# Patient Record
Sex: Male | Born: 1945 | Race: White | Hispanic: No | Marital: Married | Smoking: Current every day smoker
Health system: Southern US, Community
[De-identification: ages and names within clinical notes are randomized; demographics above are authoritative.]

## PROBLEM LIST (undated history)

## (undated) DIAGNOSIS — M109 Gout, unspecified: Secondary | ICD-10-CM

## (undated) DIAGNOSIS — E785 Hyperlipidemia, unspecified: Secondary | ICD-10-CM

## (undated) DIAGNOSIS — IMO0001 Reserved for inherently not codable concepts without codable children: Secondary | ICD-10-CM

## (undated) DIAGNOSIS — M1990 Unspecified osteoarthritis, unspecified site: Secondary | ICD-10-CM

## (undated) DIAGNOSIS — I10 Essential (primary) hypertension: Secondary | ICD-10-CM

## (undated) DIAGNOSIS — K859 Acute pancreatitis without necrosis or infection, unspecified: Secondary | ICD-10-CM

## (undated) DIAGNOSIS — K259 Gastric ulcer, unspecified as acute or chronic, without hemorrhage or perforation: Secondary | ICD-10-CM

## (undated) DIAGNOSIS — F329 Major depressive disorder, single episode, unspecified: Secondary | ICD-10-CM

## (undated) DIAGNOSIS — I2510 Atherosclerotic heart disease of native coronary artery without angina pectoris: Secondary | ICD-10-CM

## (undated) DIAGNOSIS — F32A Depression, unspecified: Secondary | ICD-10-CM

## (undated) HISTORY — PX: CHOLECYSTECTOMY: SHX55

## (undated) HISTORY — PX: KNEE ARTHROSCOPY: SUR90

## (undated) HISTORY — PX: TONSILLECTOMY: SUR1361

## (undated) HISTORY — PX: CERVICAL FUSION: SHX112

## (undated) HISTORY — PX: CARDIAC CATHETERIZATION: SHX172

## (undated) HISTORY — PX: COLONOSCOPY: SHX174

## (undated) HISTORY — PX: KIDNEY STONE SURGERY: SHX686

---

## 2004-07-03 ENCOUNTER — Ambulatory Visit: Payer: Self-pay | Admitting: Family Medicine

## 2004-07-26 ENCOUNTER — Ambulatory Visit: Payer: Self-pay | Admitting: Family Medicine

## 2004-08-08 ENCOUNTER — Ambulatory Visit: Payer: Self-pay | Admitting: Family Medicine

## 2004-11-27 ENCOUNTER — Ambulatory Visit: Payer: Self-pay | Admitting: Family Medicine

## 2004-11-30 ENCOUNTER — Ambulatory Visit: Payer: Self-pay | Admitting: Family Medicine

## 2005-04-05 ENCOUNTER — Ambulatory Visit: Payer: Self-pay | Admitting: Family Medicine

## 2012-11-18 ENCOUNTER — Other Ambulatory Visit (HOSPITAL_COMMUNITY): Payer: Self-pay | Admitting: Internal Medicine

## 2012-11-18 DIAGNOSIS — R29898 Other symptoms and signs involving the musculoskeletal system: Secondary | ICD-10-CM

## 2012-11-19 ENCOUNTER — Ambulatory Visit (HOSPITAL_COMMUNITY): Payer: Self-pay

## 2014-09-29 ENCOUNTER — Other Ambulatory Visit: Payer: Self-pay | Admitting: Orthopedic Surgery

## 2014-09-29 DIAGNOSIS — Z01818 Encounter for other preprocedural examination: Secondary | ICD-10-CM

## 2014-10-01 ENCOUNTER — Ambulatory Visit
Admission: RE | Admit: 2014-10-01 | Discharge: 2014-10-01 | Disposition: A | Discharge status: Home / Self Care | Payer: Medicare HMO | Source: Ambulatory Visit | Attending: Orthopedic Surgery | Admitting: Orthopedic Surgery

## 2014-10-01 ENCOUNTER — Ambulatory Visit
Admission: RE | Admit: 2014-10-01 | Discharge: 2014-10-01 | Disposition: A | Discharge status: Home / Self Care | Payer: Self-pay | Source: Ambulatory Visit | Attending: Orthopedic Surgery | Admitting: Orthopedic Surgery

## 2014-10-01 DIAGNOSIS — Z01818 Encounter for other preprocedural examination: Secondary | ICD-10-CM

## 2014-10-18 ENCOUNTER — Other Ambulatory Visit: Payer: Self-pay | Admitting: Orthopedic Surgery

## 2014-10-27 NOTE — Pre-Procedure Instructions (Signed)
Norberto SorensonRalph L Whitcher  10/27/2014     Your procedure is scheduled on: Monday November 08, 2014 at 10:00 AM.  [facility] Ad[facility] at 8:00 A.M.  Call this number if you have problems the morning of surgery: [phone]    Remember:  Do not eat food or drink liquids after midnight.  Take these medicines the morning of surgery with A SIP OF WATER: Allopurinol (Zyloprim), Nebivolol (Bystolic), Omeprazole (Prilosec), and Venlafaxine (Effexor XR)   Please stop taking any vitamins, herbal medications, Ibuprofen, Aleve, etc on Monday July 11th   Do not wear jewelry.  Do not wear lotions, powders, or cologne.    Men may shave face and neck.  Do not bring valuables to the hospital.  [facility] is not responsible for any belongings or valuables.  Contacts, dentures or bridgework may not be worn into surgery.  Leave your suitcase in the car.  After surgery it may be brought to your room.  For patients admitted to the hospital, discharge time will be determined by your treatment team.  Patients discharged the day of surgery will not be allowed to drive home.   Name and phone number of your driver:    Special instructions: Shower using CHG soap the night before and the morning of your surgery  Please read over the following fact sheets that you were given. Pain Booklet, Coughing and Deep Breathing, Total Joint Packet, MRSA Information and Surgical Site Infection Prevention

## 2014-10-28 ENCOUNTER — Encounter (HOSPITAL_COMMUNITY): Payer: Self-pay

## 2014-10-28 ENCOUNTER — Encounter (HOSPITAL_COMMUNITY)
Admission: RE | Admit: 2014-10-28 | Discharge: 2014-10-28 | Disposition: A | Discharge status: Home / Self Care | Payer: Medicare HMO | Source: Ambulatory Visit | Attending: Orthopedic Surgery | Admitting: Orthopedic Surgery

## 2014-10-28 ENCOUNTER — Encounter (HOSPITAL_COMMUNITY): Payer: Self-pay | Admitting: Emergency Medicine

## 2014-10-28 DIAGNOSIS — Z01812 Encounter for preprocedural laboratory examination: Secondary | ICD-10-CM | POA: Diagnosis present

## 2014-10-28 DIAGNOSIS — I10 Essential (primary) hypertension: Secondary | ICD-10-CM | POA: Insufficient documentation

## 2014-10-28 DIAGNOSIS — R0602 Shortness of breath: Secondary | ICD-10-CM | POA: Diagnosis not present

## 2014-10-28 DIAGNOSIS — Z01818 Encounter for other preprocedural examination: Secondary | ICD-10-CM

## 2014-10-28 HISTORY — DX: Gastric ulcer, unspecified as acute or chronic, without hemorrhage or perforation: K25.9

## 2014-10-28 HISTORY — DX: Hyperlipidemia, unspecified: E78.5

## 2014-10-28 HISTORY — DX: Unspecified osteoarthritis, unspecified site: M19.90

## 2014-10-28 HISTORY — DX: Atherosclerotic heart disease of native coronary artery without angina pectoris: I25.10

## 2014-10-28 HISTORY — DX: Major depressive disorder, single episode, unspecified: F32.9

## 2014-10-28 HISTORY — DX: Depression, unspecified: F32.A

## 2014-10-28 HISTORY — DX: Gout, unspecified: M10.9

## 2014-10-28 HISTORY — DX: Reserved for inherently not codable concepts without codable children: IMO0001

## 2014-10-28 HISTORY — DX: Essential (primary) hypertension: I10

## 2014-10-28 HISTORY — DX: Acute pancreatitis without necrosis or infection, unspecified: K85.90

## 2014-10-28 LAB — CBC WITH DIFFERENTIAL/PLATELET
BASOS PCT: 1 % (ref 0–1)
Basophils Absolute: 0.1 10*3/uL (ref 0.0–0.1)
EOS ABS: 0.3 10*3/uL (ref 0.0–0.7)
EOS PCT: 4 % (ref 0–5)
HCT: 44.2 % (ref 39.0–52.0)
Hemoglobin: 15.6 g/dL (ref 13.0–17.0)
LYMPHS ABS: 1.5 10*3/uL (ref 0.7–4.0)
Lymphocytes Relative: 18 % (ref 12–46)
MCH: 31.6 pg (ref 26.0–34.0)
MCHC: 35.3 g/dL (ref 30.0–36.0)
MCV: 89.5 fL (ref 78.0–100.0)
Monocytes Absolute: 0.8 10*3/uL (ref 0.1–1.0)
Monocytes Relative: 9 % (ref 3–12)
NEUTROS PCT: 68 % (ref 43–77)
Neutro Abs: 5.8 10*3/uL (ref 1.7–7.7)
PLATELETS: 222 10*3/uL (ref 150–400)
RBC: 4.94 MIL/uL (ref 4.22–5.81)
RDW: 14.3 % (ref 11.5–15.5)
WBC: 8.5 10*3/uL (ref 4.0–10.5)

## 2014-10-28 LAB — URINALYSIS, ROUTINE W REFLEX MICROSCOPIC
Bilirubin Urine: NEGATIVE
GLUCOSE, UA: NEGATIVE mg/dL
Hgb urine dipstick: NEGATIVE
KETONES UR: NEGATIVE mg/dL
LEUKOCYTES UA: NEGATIVE
Nitrite: NEGATIVE
PROTEIN: NEGATIVE mg/dL
Specific Gravity, Urine: 1.013 (ref 1.005–1.030)
UROBILINOGEN UA: 1 mg/dL (ref 0.0–1.0)
pH: 6.5 (ref 5.0–8.0)

## 2014-10-28 LAB — COMPREHENSIVE METABOLIC PANEL
ALT: 35 U/L (ref 17–63)
AST: 24 U/L (ref 15–41)
Albumin: 3.4 g/dL — ABNORMAL LOW (ref 3.5–5.0)
Alkaline Phosphatase: 197 U/L — ABNORMAL HIGH (ref 38–126)
Anion gap: 9 (ref 5–15)
BUN: 11 mg/dL (ref 6–20)
CHLORIDE: 101 mmol/L (ref 101–111)
CO2: 27 mmol/L (ref 22–32)
Calcium: 9.1 mg/dL (ref 8.9–10.3)
Creatinine, Ser: 1.52 mg/dL — ABNORMAL HIGH (ref 0.61–1.24)
GFR calc Af Amer: 53 mL/min — ABNORMAL LOW (ref >60)
GFR calc non Af Amer: 45 mL/min — ABNORMAL LOW (ref >60)
Glucose, Bld: 101 mg/dL — ABNORMAL HIGH (ref 65–99)
Potassium: 4 mmol/L (ref 3.5–5.1)
SODIUM: 137 mmol/L (ref 135–145)
Total Bilirubin: 0.7 mg/dL (ref 0.3–1.2)
Total Protein: 7.7 g/dL (ref 6.5–8.1)

## 2014-10-28 LAB — PROTIME-INR
INR: 0.99 (ref 0.00–1.49)
Prothrombin Time: 13.3 seconds (ref 11.6–15.2)

## 2014-10-28 LAB — SURGICAL PCR SCREEN
MRSA, PCR: NEGATIVE
STAPHYLOCOCCUS AUREUS: NEGATIVE

## 2014-10-28 NOTE — Progress Notes (Signed)
   10/28/14 0915  OBSTRUCTIVE SLEEP APNEA  Have you ever been diagnosed with sleep apnea through a sleep study? No  Do you snore loudly (loud enough to be heard through closed doors)?  0  Do you often feel tired, fatigued, or sleepy during the daytime? 0  Has anyone observed you stop breathing during your sleep? 0  Do you have, or are you being treated for high blood pressure? 1  BMI more than 35 kg/m2? 0  Age over 69 years old? 1  Neck circumference greater than 40 cm/16 inches? 1  Gender: 1  Obstructive Sleep Apnea Score 4   This patient has screened at risk for sleep apnea using the STOP Bang tool used during a pre-surgical visit. A score of 4 or greater is at risk for sleep apnea.

## 2014-10-28 NOTE — Progress Notes (Signed)
Patient denied having any acute cardiac or pulmonary issues, but informed Nurse that he had a cardiac cath with one stent placed three and a half years ago with cardiologist Gypsy Balsamobert Krasowski [place]  Patient informed Nurse that his LOV with Dr. Bing MatterKrasowski was a few months ago. Will request records.

## 2014-10-29 LAB — URINE CULTURE: Culture: NO GROWTH

## 2014-11-01 ENCOUNTER — Encounter (HOSPITAL_COMMUNITY): Payer: Self-pay

## 2014-11-01 NOTE — Progress Notes (Addendum)
Anesthesia Chart Review: Patient is a 69 year old male scheduled for left TKA on 11/08/14 by Dr. Ronnie Derby.  History includes smoking, CAD s/p stent (BMS RCA) '11, HTN, exertional dyspnea, pancreatitis, peptic ulcer, HLD, gout, arthritis, depression, cervical fusion, cholecystectomy, tonsillectomy. [facility] records also indicate renal artery stenosis (treated with medical therapy). OSA screening score was 4. BMI is 30.5.   Meds include allopurinol, Elavil, amlodipine, ASA, Lipitor, Flexeril, doxazosin, HCTZ, nebivolol, omeprazole, K-dur, Effexor-XR.  PCP is Dr. Teressa Lower. He last saw patient on 10/08/14.  He cleared patient if he was approved by cardiology.  He wanted patient to follow-up with Dr. Agustin Cree because patient had reported SOB X 2 days while in the shower.  Cardiologist is Dr. Agustin Cree with [facility] (formerly with [facility]). Last office records are pending. Dr. Ruel Favors office sent a signed letter of cardiac clearance (signature is illegible; note is not dated).  03/30/14 Echo [facility]): There is moderate concentric LVH. Overall LV systolic function is normal with an EF between 60-65%. Pseudonormal LV filling pattern, consistent with elevated LA pressure. LA is moderately dilated. RA is mildly enlarged. Mild AR.  01/02/14 Nuclear Stress Test [facility]): No reversible ischemia. Fixed defect in the anterior wall compatible with infarct. Normal LV wall motion. LVEF 64%.   Last cardiac cath is pending.   10/08/14 EKG (PCP): SR, occasional PAC.   Preoperative labs noted. Cr 1.52. ALK PHOS elevated at 197, AST/ALT, PT/INR and PLT count WNL. Urine culture showed no growth.   Additional follow-up once cardiac cath and most recent records from Dr. Agustin Cree received.   George Hugh [facility] Short Stay Center/Anesthesiology Phone [phone] 11/01/2014 5:37 PM  Addendum: I called and spoke with patient today. He reports he only had a couple of  days of the SOB while in the shower.  He said just before this happened he had been on something for anxiety and had come off of the medication.  Dr. Garlon Hatchet had him resume the medication and he has had no further episodes. He denied chest pain.  He reported he presented with left arm pain when he had his stent in 2011. He denied any left arm pain. He [place] prior to his left knee pain.  When asked if he had followed up with Dr. Wendy Poet office since his SOB, he reported that he had only spoken with his nurse who had instructed him to follow-up in the ED for immediate evaluation or arrange follow-up with Dr. Agustin Cree if the symptoms persisted.  Patient did not schedule follow-up since his SOB resolved.  I called and spoke with Dr. Wendy Poet nurse Chrissy today.  She confirmed that patient was seen for pre-operative evaluation on 09/09/14. She reviewed the telephone encounters and reported that Dr. Agustin Cree was not available the day that patient called to report SOB, so he was advised to follow-up with his PCP or the ED. She does not think that Dr. Agustin Cree knew that patient had the SOB. He is out of town until 11/08/14. I discussed with anesthesiologist Dr. Linna Caprice. Patient will either need to be re-evaluated by cardiology or Dr. Garlon Hatchet will need to re-address--since he is the one who initially was concerned enough to tell patient he needs to follow-up with cardiology.  Patient is now under the impression his symptoms were related to anxiety.  I will notify Dr. Ruel Favors office tomorrow.  Dr. Flo Shanks note is still pending.   Myra Gianotti, PA-C [facility] Short Stay  Center/Anesthesiology Phone [phone] 11/02/2014 6:34 PM  Addendum: This morning staff [facility]) at Dr. Ruel Favors office notified of above anesthesia recommendations.   01/03/10 [facility] ([facility]): Severe (95%) stenosis of mid RCA. 30% proximal RCA. Borderline (50%) stenosis of mid LAD. Otherwise non-obstructive  CAD. Normal LV systolic function. LVEF 60%. Successful PCI/BMS to the mid RCA. Given the large diameter and short length of the lesion, a DES offers little advantage, so a BMS was selected. Recommendations: Aggressive risk factor modification and medical therapy. Plavix therapy for at least 30 days. Consider a stress test in one month to evaluate the mid LAD lesion.   Await additional PCP and/or cardiology input.  George Hugh [facility] Short Stay Center/Anesthesiology Phone [phone] 11/03/2014 3:36 PM  Addendum: Patient to see his cardiologist on 11/09/14.  If he is cleared, he will likely be put back on the schedule within the next 2 weeks.  He would need repeat labs.  I discussed options with Dr. Ruel Favors staff--labs done at his PCP and faxed to [place], lab only appointment at [facility], or same day labs (but would not be preferable if case was rescheduled as a first case).  George Hugh [facility] Short Stay Center/Anesthesiology Phone [phone] 11/04/2014 11:05 AM

## 2014-11-03 ENCOUNTER — Encounter (HOSPITAL_COMMUNITY): Payer: Self-pay

## 2014-11-08 ENCOUNTER — Inpatient Hospital Stay (HOSPITAL_COMMUNITY): Admission: RE | Admit: 2014-11-08 | Payer: Medicare HMO | Source: Ambulatory Visit | Admitting: Orthopedic Surgery

## 2014-11-08 ENCOUNTER — Encounter (HOSPITAL_COMMUNITY): Admission: RE | Payer: Self-pay | Source: Ambulatory Visit

## 2014-11-08 SURGERY — ARTHROPLASTY, KNEE, TOTAL
Anesthesia: Spinal | Laterality: Left

## 2014-11-09 DIAGNOSIS — I2510 Atherosclerotic heart disease of native coronary artery without angina pectoris: Secondary | ICD-10-CM

## 2014-11-09 DIAGNOSIS — I701 Atherosclerosis of renal artery: Secondary | ICD-10-CM

## 2014-11-09 DIAGNOSIS — I10 Essential (primary) hypertension: Secondary | ICD-10-CM

## 2014-11-09 DIAGNOSIS — F17200 Nicotine dependence, unspecified, uncomplicated: Secondary | ICD-10-CM

## 2014-11-09 HISTORY — DX: Atherosclerosis of renal artery: I70.1

## 2014-11-09 HISTORY — DX: Essential (primary) hypertension: I10

## 2014-11-09 HISTORY — DX: Atherosclerotic heart disease of native coronary artery without angina pectoris: I25.10

## 2014-11-09 HISTORY — DX: Nicotine dependence, unspecified, uncomplicated: F17.200

## 2014-11-10 ENCOUNTER — Other Ambulatory Visit: Payer: Self-pay | Admitting: Orthopedic Surgery

## 2014-11-10 ENCOUNTER — Encounter (HOSPITAL_COMMUNITY): Payer: Self-pay | Admitting: Pharmacy Technician

## 2014-11-11 ENCOUNTER — Encounter (HOSPITAL_COMMUNITY)
Admission: RE | Admit: 2014-11-11 | Discharge: 2014-11-11 | Disposition: A | Discharge status: Home / Self Care | Payer: Medicare HMO | Source: Ambulatory Visit | Attending: Orthopedic Surgery | Admitting: Orthopedic Surgery

## 2014-11-11 ENCOUNTER — Encounter (HOSPITAL_COMMUNITY): Payer: Self-pay

## 2014-11-11 DIAGNOSIS — M1712 Unilateral primary osteoarthritis, left knee: Secondary | ICD-10-CM | POA: Insufficient documentation

## 2014-11-11 DIAGNOSIS — Z01818 Encounter for other preprocedural examination: Secondary | ICD-10-CM | POA: Diagnosis not present

## 2014-11-11 DIAGNOSIS — Z01812 Encounter for preprocedural laboratory examination: Secondary | ICD-10-CM | POA: Diagnosis not present

## 2014-11-11 LAB — URINALYSIS, ROUTINE W REFLEX MICROSCOPIC
BILIRUBIN URINE: NEGATIVE
Glucose, UA: NEGATIVE mg/dL
HGB URINE DIPSTICK: NEGATIVE
Ketones, ur: NEGATIVE mg/dL
Leukocytes, UA: NEGATIVE
Nitrite: NEGATIVE
Protein, ur: NEGATIVE mg/dL
Specific Gravity, Urine: 1.013 (ref 1.005–1.030)
UROBILINOGEN UA: 1 mg/dL (ref 0.0–1.0)
pH: 6.5 (ref 5.0–8.0)

## 2014-11-11 LAB — CBC WITH DIFFERENTIAL/PLATELET
Basophils Absolute: 0.1 10*3/uL (ref 0.0–0.1)
Basophils Relative: 1 % (ref 0–1)
Eosinophils Absolute: 0.3 10*3/uL (ref 0.0–0.7)
Eosinophils Relative: 3 % (ref 0–5)
HEMATOCRIT: 44.6 % (ref 39.0–52.0)
HEMOGLOBIN: 15.9 g/dL (ref 13.0–17.0)
LYMPHS PCT: 20 % (ref 12–46)
Lymphs Abs: 1.7 10*3/uL (ref 0.7–4.0)
MCH: 31.7 pg (ref 26.0–34.0)
MCHC: 35.7 g/dL (ref 30.0–36.0)
MCV: 88.8 fL (ref 78.0–100.0)
Monocytes Absolute: 0.7 10*3/uL (ref 0.1–1.0)
Monocytes Relative: 8 % (ref 3–12)
NEUTROS PCT: 68 % (ref 43–77)
Neutro Abs: 5.9 10*3/uL (ref 1.7–7.7)
PLATELETS: 224 10*3/uL (ref 150–400)
RBC: 5.02 MIL/uL (ref 4.22–5.81)
RDW: 14.1 % (ref 11.5–15.5)
WBC: 8.6 10*3/uL (ref 4.0–10.5)

## 2014-11-11 LAB — COMPREHENSIVE METABOLIC PANEL
ALK PHOS: 178 U/L — AB (ref 38–126)
ALT: 40 U/L (ref 17–63)
AST: 26 U/L (ref 15–41)
Albumin: 3.5 g/dL (ref 3.5–5.0)
Anion gap: 9 (ref 5–15)
BILIRUBIN TOTAL: 1 mg/dL (ref 0.3–1.2)
BUN: 14 mg/dL (ref 6–20)
CHLORIDE: 101 mmol/L (ref 101–111)
CO2: 26 mmol/L (ref 22–32)
CREATININE: 1.5 mg/dL — AB (ref 0.61–1.24)
Calcium: 9 mg/dL (ref 8.9–10.3)
GFR calc Af Amer: 53 mL/min — ABNORMAL LOW (ref >60)
GFR calc non Af Amer: 46 mL/min — ABNORMAL LOW (ref >60)
Glucose, Bld: 105 mg/dL — ABNORMAL HIGH (ref 65–99)
POTASSIUM: 3.3 mmol/L — AB (ref 3.5–5.1)
Sodium: 136 mmol/L (ref 135–145)
Total Protein: 7.4 g/dL (ref 6.5–8.1)

## 2014-11-11 LAB — APTT: aPTT: 27 seconds (ref 24–37)

## 2014-11-11 LAB — PROTIME-INR
INR: 1.03 (ref 0.00–1.49)
PROTHROMBIN TIME: 13.7 s (ref 11.6–15.2)

## 2014-11-11 MED ORDER — SODIUM CHLORIDE 0.9 % IV SOLN: 1000.0000 mg | INTRAVENOUS | Status: DC

## 2014-11-11 MED ORDER — SODIUM CHLORIDE 0.9 % IV SOLN: INTRAVENOUS | Status: DC

## 2014-11-11 MED ORDER — CHLORHEXIDINE GLUCONATE 4 % EX LIQD: 60.0000 mL | Freq: Once | CUTANEOUS | Status: DC

## 2014-11-11 MED ORDER — CEFAZOLIN SODIUM-DEXTROSE 2-3 GM-% IV SOLR: 2.0000 g | INTRAVENOUS | Status: DC

## 2014-11-11 NOTE — Progress Notes (Signed)
Anesthesia follow-up: See my note from 11/02/14 with addendums. Patient was scheduled for left TKA by Dr. Sherlean Foot on 11/08/14, but was postponed to allow time for re-evaluation by his cardiologist. He was seen by Dr. Bing Matter on 11/09/14 (see Care Everywhere). He felt patient was an "acceptable candidate for surgery..." No new cardiac testing was ordered.   Patient had a lab only visit this morning. Labs noted. UA WNL, but urine culture is still pending.  He had previously been given medical clearance by Dr. Sol Passer pending cardiac clearance which has now been obtained.  If no acute changes then I anticipate that he can proceed as planned.  Velna Ochs [facility] Short Stay Center/Anesthesiology Phone [phone] 11/11/2014 12:16 PM

## 2014-11-11 NOTE — Pre-Procedure Instructions (Signed)
Richard Choi  11/11/2014     Your procedure is scheduled on: Monday November 15, 2014   Report to [facility] Admitting at 5:30 A.M.                Your surgery is scheduled for 7:30AM.                   Call this number if you have problems the morning of surgery: [phone]    Remember:  Do not eat food or drink liquids after midnight Sunday, July 24th.    Take these medicines the morning of surgery with A SIP OF WATER:amLODipine (NORVASC), Allopurinol (Zyloprim), Nebivolol (Bystolic), Omeprazole (Prilosec), and Venlafaxine (Effexor XR)   Please stop taking any vitamins, herbal medications, Ibuprofen, Aleve, etc on Monday July 11th   Do not wear jewelry.  Do not wear lotions, powders, or cologne.    Men may shave face and neck.  Do not bring valuables to the hospital.  [facility] is not responsible for any belongings or valuables.  Contacts, dentures or bridgework may not be worn into surgery.  Leave your suitcase in the car.  After surgery it may be brought to your room.  For patients admitted to the hospital, discharge time will be determined by your treatment team.  Patients discharged the day of surgery will not be allowed to drive home.   Name and phone number of your driver:    Special instructions: Shower using CHG soap the night before and the morning of your surgery  Please read over the following fact sheets that you were given. Pain Booklet, Coughing and Deep Breathing, Total Joint Packet, MRSA Information and Surgical Site Infection Prevention

## 2014-11-12 LAB — URINE CULTURE: Culture: NO GROWTH

## 2014-11-12 MED ORDER — SODIUM CHLORIDE 0.9 % IV SOLN
2000.0000 mg | INTRAVENOUS | Status: DC
  Filled 2014-11-12: qty 20

## 2014-11-12 MED ORDER — BUPIVACAINE LIPOSOME 1.3 % IJ SUSP
20.0000 mL | INTRAMUSCULAR | Status: DC
  Filled 2014-11-12 (×3): qty 20

## 2014-11-14 MED ORDER — VANCOMYCIN HCL 10 G IV SOLR
1500.0000 mg | INTRAVENOUS | Status: AC
  Administered 2014-11-15: 1500 mg via INTRAVENOUS
  Filled 2014-11-14: qty 1500

## 2014-11-15 ENCOUNTER — Inpatient Hospital Stay (HOSPITAL_COMMUNITY)
Admission: RE | Admit: 2014-11-15 | Discharge: 2014-11-16 | DRG: 470 | Disposition: A | Discharge status: Home Health | Payer: Medicare HMO | Source: Ambulatory Visit | Attending: Orthopedic Surgery | Admitting: Orthopedic Surgery

## 2014-11-15 ENCOUNTER — Encounter (HOSPITAL_COMMUNITY)
Admission: RE | Disposition: A | Discharge status: Home Health | Payer: Self-pay | Source: Ambulatory Visit | Attending: Orthopedic Surgery

## 2014-11-15 ENCOUNTER — Inpatient Hospital Stay (HOSPITAL_COMMUNITY): Payer: Medicare HMO | Admitting: Vascular Surgery

## 2014-11-15 ENCOUNTER — Inpatient Hospital Stay (HOSPITAL_COMMUNITY): Payer: Medicare HMO | Admitting: Certified Registered Nurse Anesthetist

## 2014-11-15 ENCOUNTER — Encounter (HOSPITAL_COMMUNITY): Payer: Self-pay | Admitting: *Deleted

## 2014-11-15 DIAGNOSIS — Z7982 Long term (current) use of aspirin: Secondary | ICD-10-CM | POA: Diagnosis not present

## 2014-11-15 DIAGNOSIS — Z79899 Other long term (current) drug therapy: Secondary | ICD-10-CM

## 2014-11-15 DIAGNOSIS — Z955 Presence of coronary angioplasty implant and graft: Secondary | ICD-10-CM

## 2014-11-15 DIAGNOSIS — E785 Hyperlipidemia, unspecified: Secondary | ICD-10-CM | POA: Diagnosis present

## 2014-11-15 DIAGNOSIS — I2510 Atherosclerotic heart disease of native coronary artery without angina pectoris: Secondary | ICD-10-CM | POA: Diagnosis present

## 2014-11-15 DIAGNOSIS — F329 Major depressive disorder, single episode, unspecified: Secondary | ICD-10-CM | POA: Diagnosis present

## 2014-11-15 DIAGNOSIS — Z96659 Presence of unspecified artificial knee joint: Secondary | ICD-10-CM

## 2014-11-15 DIAGNOSIS — M109 Gout, unspecified: Secondary | ICD-10-CM | POA: Diagnosis present

## 2014-11-15 DIAGNOSIS — Z888 Allergy status to other drugs, medicaments and biological substances status: Secondary | ICD-10-CM

## 2014-11-15 DIAGNOSIS — I10 Essential (primary) hypertension: Secondary | ICD-10-CM | POA: Diagnosis present

## 2014-11-15 DIAGNOSIS — M1712 Unilateral primary osteoarthritis, left knee: Secondary | ICD-10-CM | POA: Diagnosis present

## 2014-11-15 DIAGNOSIS — M25562 Pain in left knee: Secondary | ICD-10-CM | POA: Diagnosis present

## 2014-11-15 DIAGNOSIS — F17210 Nicotine dependence, cigarettes, uncomplicated: Secondary | ICD-10-CM | POA: Diagnosis present

## 2014-11-15 DIAGNOSIS — Z981 Arthrodesis status: Secondary | ICD-10-CM

## 2014-11-15 DIAGNOSIS — Z881 Allergy status to other antibiotic agents status: Secondary | ICD-10-CM | POA: Diagnosis not present

## 2014-11-15 HISTORY — PX: TOTAL KNEE ARTHROPLASTY: SHX125

## 2014-11-15 HISTORY — DX: Presence of unspecified artificial knee joint: Z96.659

## 2014-11-15 LAB — CBC
HCT: 42 % (ref 39.0–52.0)
Hemoglobin: 14.6 g/dL (ref 13.0–17.0)
MCH: 31.3 pg (ref 26.0–34.0)
MCHC: 34.8 g/dL (ref 30.0–36.0)
MCV: 89.9 fL (ref 78.0–100.0)
Platelets: 215 10*3/uL (ref 150–400)
RBC: 4.67 MIL/uL (ref 4.22–5.81)
RDW: 14.1 % (ref 11.5–15.5)
WBC: 12.6 10*3/uL — ABNORMAL HIGH (ref 4.0–10.5)

## 2014-11-15 LAB — CREATININE, SERUM
Creatinine, Ser: 1.41 mg/dL — ABNORMAL HIGH (ref 0.61–1.24)
GFR, EST AFRICAN AMERICAN: 58 mL/min — AB (ref >60)
GFR, EST NON AFRICAN AMERICAN: 50 mL/min — AB (ref >60)

## 2014-11-15 SURGERY — ARTHROPLASTY, KNEE, TOTAL
Anesthesia: Monitor Anesthesia Care | Site: Knee | Laterality: Left

## 2014-11-15 MED ORDER — PHENOL 1.4 % MT LIQD: 1.0000 | OROMUCOSAL | Status: DC | PRN

## 2014-11-15 MED ORDER — MIDAZOLAM HCL 2 MG/2ML IJ SOLN
INTRAMUSCULAR | Status: AC
  Filled 2014-11-15: qty 2

## 2014-11-15 MED ORDER — CHLORHEXIDINE GLUCONATE 4 % EX LIQD: 60.0000 mL | Freq: Once | CUTANEOUS | Status: DC

## 2014-11-15 MED ORDER — DIPHENHYDRAMINE HCL 12.5 MG/5ML PO ELIX: 12.5000 mg | ORAL_SOLUTION | ORAL | Status: DC | PRN

## 2014-11-15 MED ORDER — ONDANSETRON HCL 4 MG/2ML IJ SOLN: 4.0000 mg | Freq: Four times a day (QID) | INTRAMUSCULAR | Status: DC | PRN

## 2014-11-15 MED ORDER — FLEET ENEMA 7-19 GM/118ML RE ENEM: 1.0000 | ENEMA | Freq: Once | RECTAL | Status: AC | PRN

## 2014-11-15 MED ORDER — PROMETHAZINE HCL 25 MG/ML IJ SOLN: 6.2500 mg | INTRAMUSCULAR | Status: DC | PRN

## 2014-11-15 MED ORDER — NEBIVOLOL HCL 5 MG PO TABS
5.0000 mg | ORAL_TABLET | Freq: Two times a day (BID) | ORAL | Status: DC
  Administered 2014-11-15 – 2014-11-16 (×2): 5 mg via ORAL
  Filled 2014-11-15 (×3): qty 1

## 2014-11-15 MED ORDER — ONDANSETRON HCL 4 MG PO TABS: 4.0000 mg | ORAL_TABLET | Freq: Four times a day (QID) | ORAL | Status: DC | PRN

## 2014-11-15 MED ORDER — VANCOMYCIN HCL IN DEXTROSE 1-5 GM/200ML-% IV SOLN
1000.0000 mg | Freq: Two times a day (BID) | INTRAVENOUS | Status: AC
  Administered 2014-11-15: 1000 mg via INTRAVENOUS
  Filled 2014-11-15: qty 200

## 2014-11-15 MED ORDER — METHOCARBAMOL 1000 MG/10ML IJ SOLN: 500.0000 mg | Freq: Four times a day (QID) | INTRAVENOUS | Status: DC | PRN

## 2014-11-15 MED ORDER — MENTHOL 3 MG MT LOZG: 1.0000 | LOZENGE | OROMUCOSAL | Status: DC | PRN

## 2014-11-15 MED ORDER — ENOXAPARIN SODIUM 30 MG/0.3ML ~~LOC~~ SOLN
30.0000 mg | Freq: Two times a day (BID) | SUBCUTANEOUS | Status: DC
  Administered 2014-11-16: 30 mg via SUBCUTANEOUS
  Filled 2014-11-15: qty 0.3

## 2014-11-15 MED ORDER — DOCUSATE SODIUM 100 MG PO CAPS
100.0000 mg | ORAL_CAPSULE | Freq: Two times a day (BID) | ORAL | Status: DC
  Administered 2014-11-15 – 2014-11-16 (×3): 100 mg via ORAL
  Filled 2014-11-15 (×3): qty 1

## 2014-11-15 MED ORDER — OXYCODONE HCL 5 MG PO TABS: 5.0000 mg | ORAL_TABLET | Freq: Once | ORAL | Status: DC | PRN

## 2014-11-15 MED ORDER — BISACODYL 5 MG PO TBEC: 5.0000 mg | DELAYED_RELEASE_TABLET | Freq: Every day | ORAL | Status: DC | PRN

## 2014-11-15 MED ORDER — BUPIVACAINE LIPOSOME 1.3 % IJ SUSP
INTRAMUSCULAR | Status: DC | PRN
  Administered 2014-11-15: 20 mL

## 2014-11-15 MED ORDER — BUPIVACAINE IN DEXTROSE 0.75-8.25 % IT SOLN
INTRATHECAL | Status: DC | PRN
  Administered 2014-11-15: 2 mL via INTRATHECAL

## 2014-11-15 MED ORDER — ATORVASTATIN CALCIUM 40 MG PO TABS
40.0000 mg | ORAL_TABLET | Freq: Every day | ORAL | Status: DC
Start: 2014-11-15 — End: 2014-11-16
  Administered 2014-11-15 – 2014-11-16 (×2): 40 mg via ORAL
  Filled 2014-11-15 (×2): qty 1

## 2014-11-15 MED ORDER — TRANEXAMIC ACID 1000 MG/10ML IV SOLN
2000.0000 mg | INTRAVENOUS | Status: DC | PRN
  Administered 2014-11-15: 2000 mg via INTRAVENOUS

## 2014-11-15 MED ORDER — SODIUM CHLORIDE 0.9 % IV SOLN: INTRAVENOUS | Status: DC

## 2014-11-15 MED ORDER — ALUM & MAG HYDROXIDE-SIMETH 200-200-20 MG/5ML PO SUSP: 30.0000 mL | ORAL | Status: DC | PRN

## 2014-11-15 MED ORDER — ACETAMINOPHEN 325 MG PO TABS: 650.0000 mg | ORAL_TABLET | Freq: Four times a day (QID) | ORAL | Status: DC | PRN

## 2014-11-15 MED ORDER — CYCLOBENZAPRINE HCL 10 MG PO TABS
10.0000 mg | ORAL_TABLET | Freq: Every day | ORAL | Status: DC
  Administered 2014-11-15 – 2014-11-16 (×2): 10 mg via ORAL
  Filled 2014-11-15 (×2): qty 1

## 2014-11-15 MED ORDER — FENTANYL CITRATE (PF) 250 MCG/5ML IJ SOLN
INTRAMUSCULAR | Status: AC
  Filled 2014-11-15: qty 5

## 2014-11-15 MED ORDER — POTASSIUM CHLORIDE ER 10 MEQ PO TBCR
10.0000 meq | EXTENDED_RELEASE_TABLET | Freq: Every day | ORAL | Status: DC
  Administered 2014-11-15 – 2014-11-16 (×2): 10 meq via ORAL
  Filled 2014-11-15 (×2): qty 1

## 2014-11-15 MED ORDER — LIDOCAINE HCL (CARDIAC) 20 MG/ML IV SOLN
INTRAVENOUS | Status: DC | PRN
  Administered 2014-11-15: 40 mg via INTRAVENOUS

## 2014-11-15 MED ORDER — ACETAMINOPHEN 650 MG RE SUPP: 650.0000 mg | Freq: Four times a day (QID) | RECTAL | Status: DC | PRN

## 2014-11-15 MED ORDER — FENTANYL CITRATE (PF) 250 MCG/5ML IJ SOLN
INTRAMUSCULAR | Status: DC | PRN
  Administered 2014-11-15: 100 ug via INTRAVENOUS

## 2014-11-15 MED ORDER — SENNOSIDES-DOCUSATE SODIUM 8.6-50 MG PO TABS: 1.0000 | ORAL_TABLET | Freq: Every evening | ORAL | Status: DC | PRN

## 2014-11-15 MED ORDER — METOCLOPRAMIDE HCL 5 MG PO TABS: 5.0000 mg | ORAL_TABLET | Freq: Three times a day (TID) | ORAL | Status: DC | PRN

## 2014-11-15 MED ORDER — BUPIVACAINE-EPINEPHRINE (PF) 0.5% -1:200000 IJ SOLN
INTRAMUSCULAR | Status: AC
  Filled 2014-11-15: qty 30

## 2014-11-15 MED ORDER — AMLODIPINE BESYLATE 10 MG PO TABS
10.0000 mg | ORAL_TABLET | Freq: Every day | ORAL | Status: DC
  Administered 2014-11-15 – 2014-11-16 (×2): 10 mg via ORAL
  Filled 2014-11-15 (×2): qty 1

## 2014-11-15 MED ORDER — SODIUM CHLORIDE 0.9 % IV SOLN
INTRAVENOUS | Status: DC
  Administered 2014-11-15: 14:00:00 via INTRAVENOUS

## 2014-11-15 MED ORDER — PROPOFOL 10 MG/ML IV BOLUS
INTRAVENOUS | Status: DC | PRN
  Administered 2014-11-15: 20 mg via INTRAVENOUS

## 2014-11-15 MED ORDER — PANTOPRAZOLE SODIUM 40 MG PO TBEC
40.0000 mg | DELAYED_RELEASE_TABLET | Freq: Every day | ORAL | Status: DC
  Administered 2014-11-16: 40 mg via ORAL
  Filled 2014-11-15: qty 1

## 2014-11-15 MED ORDER — ZOLPIDEM TARTRATE 5 MG PO TABS: 5.0000 mg | ORAL_TABLET | Freq: Every evening | ORAL | Status: DC | PRN

## 2014-11-15 MED ORDER — PHENYLEPHRINE HCL 10 MG/ML IJ SOLN
INTRAMUSCULAR | Status: DC | PRN
  Administered 2014-11-15: 60 ug via INTRAVENOUS

## 2014-11-15 MED ORDER — OXYCODONE HCL 5 MG/5ML PO SOLN: 5.0000 mg | Freq: Once | ORAL | Status: DC | PRN

## 2014-11-15 MED ORDER — OXYCODONE HCL 5 MG PO TABS
5.0000 mg | ORAL_TABLET | ORAL | Status: DC | PRN
  Administered 2014-11-15 – 2014-11-16 (×7): 10 mg via ORAL
  Filled 2014-11-15 (×7): qty 2

## 2014-11-15 MED ORDER — LACTATED RINGERS IV SOLN
INTRAVENOUS | Status: DC | PRN
  Administered 2014-11-15 (×2): via INTRAVENOUS

## 2014-11-15 MED ORDER — MIDAZOLAM HCL 5 MG/5ML IJ SOLN
INTRAMUSCULAR | Status: DC | PRN
  Administered 2014-11-15: 2 mg via INTRAVENOUS

## 2014-11-15 MED ORDER — PROPOFOL INFUSION 10 MG/ML OPTIME
INTRAVENOUS | Status: DC | PRN
  Administered 2014-11-15: 75 ug/kg/min via INTRAVENOUS

## 2014-11-15 MED ORDER — ALLOPURINOL 100 MG PO TABS
100.0000 mg | ORAL_TABLET | Freq: Two times a day (BID) | ORAL | Status: DC
  Administered 2014-11-15 – 2014-11-16 (×2): 100 mg via ORAL
  Filled 2014-11-15 (×2): qty 1

## 2014-11-15 MED ORDER — DOXAZOSIN MESYLATE 2 MG PO TABS
2.0000 mg | ORAL_TABLET | Freq: Every day | ORAL | Status: DC
  Administered 2014-11-15 – 2014-11-16 (×2): 2 mg via ORAL
  Filled 2014-11-15 (×2): qty 1

## 2014-11-15 MED ORDER — VENLAFAXINE HCL ER 150 MG PO CP24
150.0000 mg | ORAL_CAPSULE | Freq: Every day | ORAL | Status: DC
  Administered 2014-11-15 – 2014-11-16 (×2): 150 mg via ORAL
  Filled 2014-11-15 (×2): qty 1

## 2014-11-15 MED ORDER — HYDROMORPHONE HCL 1 MG/ML IJ SOLN: 0.2500 mg | INTRAMUSCULAR | Status: DC | PRN

## 2014-11-15 MED ORDER — METHOCARBAMOL 500 MG PO TABS
500.0000 mg | ORAL_TABLET | Freq: Four times a day (QID) | ORAL | Status: DC | PRN
  Administered 2014-11-15 – 2014-11-16 (×3): 500 mg via ORAL
  Filled 2014-11-15 (×3): qty 1

## 2014-11-15 MED ORDER — HYDROCHLOROTHIAZIDE 25 MG PO TABS
25.0000 mg | ORAL_TABLET | Freq: Every day | ORAL | Status: DC
  Administered 2014-11-15 – 2014-11-16 (×2): 25 mg via ORAL
  Filled 2014-11-15 (×2): qty 1

## 2014-11-15 MED ORDER — BUPIVACAINE-EPINEPHRINE 0.5% -1:200000 IJ SOLN
INTRAMUSCULAR | Status: DC | PRN
  Administered 2014-11-15: 30 mL

## 2014-11-15 MED ORDER — OXYCODONE HCL ER 10 MG PO T12A
10.0000 mg | EXTENDED_RELEASE_TABLET | Freq: Two times a day (BID) | ORAL | Status: DC
  Administered 2014-11-15 – 2014-11-16 (×3): 10 mg via ORAL
  Filled 2014-11-15 (×3): qty 1

## 2014-11-15 MED ORDER — AMITRIPTYLINE HCL 25 MG PO TABS
25.0000 mg | ORAL_TABLET | Freq: Every day | ORAL | Status: DC
  Administered 2014-11-15 – 2014-11-16 (×2): 25 mg via ORAL
  Filled 2014-11-15 (×2): qty 1

## 2014-11-15 MED ORDER — METOCLOPRAMIDE HCL 5 MG/ML IJ SOLN: 5.0000 mg | Freq: Three times a day (TID) | INTRAMUSCULAR | Status: DC | PRN

## 2014-11-15 MED ORDER — 0.9 % SODIUM CHLORIDE (POUR BTL) OPTIME
TOPICAL | Status: DC | PRN
  Administered 2014-11-15: 1000 mL

## 2014-11-15 MED ORDER — HYDROMORPHONE HCL 1 MG/ML IJ SOLN
1.0000 mg | INTRAMUSCULAR | Status: DC | PRN
  Administered 2014-11-15 – 2014-11-16 (×5): 1 mg via INTRAVENOUS
  Filled 2014-11-15 (×5): qty 1

## 2014-11-15 SURGICAL SUPPLY — 64 items
BANDAGE ESMARK 6X9 LF (GAUZE/BANDAGES/DRESSINGS) ×1 IMPLANT
BLADE SAGITTAL 13X1.27X60 (BLADE) ×2 IMPLANT
BLADE SAGITTAL 13X1.27X60MM (BLADE) ×1
BLADE SAW SGTL 83.5X18.5 (BLADE) ×3 IMPLANT
BLADE SURG 10 STRL SS (BLADE) ×3 IMPLANT
BLOCK CUTTING FEMUR 5 LT (MISCELLANEOUS) ×1 IMPLANT
BLOCK CUTTING TIBIAL 4 MED (MISCELLANEOUS) ×3 IMPLANT
BNDG CMPR 9X6 STRL LF SNTH (GAUZE/BANDAGES/DRESSINGS) ×1
BNDG ESMARK 6X9 LF (GAUZE/BANDAGES/DRESSINGS) ×3
BOWL SMART MIX CTS (DISPOSABLE) ×3 IMPLANT
CAPT KNEE TOTAL 3 ×2 IMPLANT
CEMENT BONE SIMPLEX SPEEDSET (Cement) ×6 IMPLANT
COVER SURGICAL LIGHT HANDLE (MISCELLANEOUS) ×3 IMPLANT
CUFF TOURNIQUET SINGLE 34IN LL (TOURNIQUET CUFF) ×3 IMPLANT
DRAIN HEMOVAC 7FR (DRAIN) ×3 IMPLANT
DRAPE EXTREMITY T 121X128X90 (DRAPE) ×3 IMPLANT
DRAPE IMP U-DRAPE 54X76 (DRAPES) ×3 IMPLANT
DRAPE INCISE IOBAN 66X45 STRL (DRAPES) ×6 IMPLANT
DRAPE PROXIMA HALF (DRAPES) IMPLANT
DRAPE U-SHAPE 47X51 STRL (DRAPES) ×3 IMPLANT
DRSG ADAPTIC 3X8 NADH LF (GAUZE/BANDAGES/DRESSINGS) ×3 IMPLANT
DRSG PAD ABDOMINAL 8X10 ST (GAUZE/BANDAGES/DRESSINGS) ×3 IMPLANT
DURAPREP 26ML APPLICATOR (WOUND CARE) ×6 IMPLANT
ELECT REM PT RETURN 9FT ADLT (ELECTROSURGICAL) ×3
ELECTRODE REM PT RTRN 9FT ADLT (ELECTROSURGICAL) ×1 IMPLANT
FEMUR CUTTING BLOCK ×3 IMPLANT
GAUZE SPONGE 4X4 12PLY STRL (GAUZE/BANDAGES/DRESSINGS) ×3 IMPLANT
GLOVE BIOGEL M 7.0 STRL (GLOVE) IMPLANT
GLOVE BIOGEL PI IND STRL 7.5 (GLOVE) IMPLANT
GLOVE BIOGEL PI IND STRL 8.5 (GLOVE) ×2 IMPLANT
GLOVE BIOGEL PI INDICATOR 7.5 (GLOVE)
GLOVE BIOGEL PI INDICATOR 8.5 (GLOVE) ×4
GLOVE SURG ORTHO 8.0 STRL STRW (GLOVE) ×6 IMPLANT
GOWN STRL REUS W/ TWL LRG LVL3 (GOWN DISPOSABLE) ×1 IMPLANT
GOWN STRL REUS W/ TWL XL LVL3 (GOWN DISPOSABLE) ×2 IMPLANT
GOWN STRL REUS W/TWL LRG LVL3 (GOWN DISPOSABLE) ×3
GOWN STRL REUS W/TWL XL LVL3 (GOWN DISPOSABLE) ×6
HANDPIECE INTERPULSE COAX TIP (DISPOSABLE) ×3
HOOD PEEL AWAY FACE SHEILD DIS (HOOD) ×9 IMPLANT
KIT BASIN OR (CUSTOM PROCEDURE TRAY) ×3 IMPLANT
KIT ROOM TURNOVER OR (KITS) ×3 IMPLANT
MANIFOLD NEPTUNE II (INSTRUMENTS) ×3 IMPLANT
NEEDLE 22X1 1/2 (OR ONLY) (NEEDLE) ×6 IMPLANT
NS IRRIG 1000ML POUR BTL (IV SOLUTION) ×3 IMPLANT
PACK TOTAL JOINT (CUSTOM PROCEDURE TRAY) ×3 IMPLANT
PACK UNIVERSAL I (CUSTOM PROCEDURE TRAY) ×3 IMPLANT
PAD ARMBOARD 7.5X6 YLW CONV (MISCELLANEOUS) ×6 IMPLANT
PADDING CAST COTTON 6X4 STRL (CAST SUPPLIES) ×3 IMPLANT
SET HNDPC FAN SPRY TIP SCT (DISPOSABLE) ×1 IMPLANT
SPONGE GAUZE 4X4 12PLY STER LF (GAUZE/BANDAGES/DRESSINGS) ×2 IMPLANT
STAPLER VISISTAT 35W (STAPLE) ×3 IMPLANT
SUCTION FRAZIER TIP 10 FR DISP (SUCTIONS) ×3 IMPLANT
SUT BONE WAX W31G (SUTURE) ×3 IMPLANT
SUT VIC AB 0 CTB1 27 (SUTURE) ×6 IMPLANT
SUT VIC AB 1 CT1 27 (SUTURE) ×6
SUT VIC AB 1 CT1 27XBRD ANBCTR (SUTURE) ×2 IMPLANT
SUT VIC AB 2-0 CT1 27 (SUTURE) ×6
SUT VIC AB 2-0 CT1 TAPERPNT 27 (SUTURE) ×2 IMPLANT
SYR 20CC LL (SYRINGE) ×6 IMPLANT
TIBIAL CUTTING BLOCK ×3 IMPLANT
TOWEL OR 17X24 6PK STRL BLUE (TOWEL DISPOSABLE) ×3 IMPLANT
TOWEL OR 17X26 10 PK STRL BLUE (TOWEL DISPOSABLE) ×3 IMPLANT
TRAY CATH 16FR W/PLASTIC CATH (SET/KITS/TRAYS/PACK) ×3 IMPLANT
WATER STERILE IRR 1000ML POUR (IV SOLUTION) ×6 IMPLANT

## 2014-11-15 NOTE — Transfer of Care (Signed)
Immediate Anesthesia Transfer of Care Note  Patient: Richard Choi  Procedure(s) Performed: Procedure(s): TOTAL KNEE ARTHROPLASTY (Left)  Patient Location: PACU  Anesthesia Type:Spinal  Level of Consciousness: awake, alert  and oriented  Airway & Oxygen Therapy: Patient Spontanous Breathing and Patient connected to nasal cannula oxygen  Post-op Assessment: Report given to RN and Post -op Vital signs reviewed and stable  Post vital signs: Reviewed and stable  Last Vitals:  Filed Vitals:   11/15/14 0948  BP:   Pulse: 67  Temp:   Resp:     Complications: No apparent anesthesia complications

## 2014-11-15 NOTE — Plan of Care (Signed)
Problem: Consults Goal: Diagnosis- Total Joint Replacement Outcome: Completed/Met Date Met:  11/15/14 Primary Total Knee left

## 2014-11-15 NOTE — Progress Notes (Signed)
Orthopedic Tech Progress Note Patient Details:  Richard Choi [birth year] [id number]  CPM Left Knee CPM Left Knee: On Left Knee Flexion (Degrees): 90 Left Knee Extension (Degrees): 0 Additional Comments: Trapeze bar and foot roll   Shawnie Pons 11/15/2014, 11:03 AM

## 2014-11-15 NOTE — Progress Notes (Signed)
Utilization review completed.  

## 2014-11-15 NOTE — H&P (Signed)
Richard Choi MRN:  [id number] DOB/SEX:  02-Oct-1945/male  CHIEF COMPLAINT:  Painful left Knee  HISTORY: Patient is a 69 y.o. male presented with a history of pain in the left knee. Onset of symptoms was gradual starting several years ago with gradually worsening course since that time. Prior procedures on the knee include arthroscopy. Patient has been treated conservatively with over-the-counter NSAIDs and activity modification. Patient currently rates pain in the knee at 10 out of 10 with activity. There is pain at night.  PAST MEDICAL HISTORY: There are no active problems to display for this patient.  Past Medical History  Diagnosis Date  . Hypertension   . Shortness of breath dyspnea     with ambulation  . Depression   . Pancreatitis     approx 7-8 years ago  . Arthritis   . Stomach ulcer   . Gout   . Hyperlipemia   . Coronary artery disease    Past Surgical History  Procedure Laterality Date  . Tonsillectomy    . Kidney stone surgery    . Cholecystectomy    . Knee arthroscopy Left     X 3   . Cervical fusion    . Colonoscopy    . Cardiac catheterization      01/03/10 ([facility]): NL LM, LCX, Ramus. 50% mLAD. 30% pRCA. 95% mRCA s/p BMS.     MEDICATIONS:   Prescriptions prior to admission  Medication Sig Dispense Refill Last Dose  . allopurinol (ZYLOPRIM) 100 MG tablet Take 100 mg by mouth 2 (two) times daily.   11/15/2014 at Unknown time  . amitriptyline (ELAVIL) 25 MG tablet Take 25 mg by mouth daily.   11/14/2014 at Unknown time  . amLODipine (NORVASC) 10 MG tablet Take 10 mg by mouth daily.   11/14/2014 at Unknown time  . aspirin EC 81 MG tablet Take 81 mg by mouth daily.   11/14/2014 at Unknown time  . atorvastatin (LIPITOR) 40 MG tablet Take 40 mg by mouth daily.   11/14/2014 at Unknown time  . cyclobenzaprine (FLEXERIL) 10 MG tablet Take 10 mg by mouth daily.   11/14/2014 at Unknown time  . doxazosin (CARDURA) 2 MG tablet Take 2 mg by mouth daily.   11/14/2014 at  Unknown time  . hydrochlorothiazide (HYDRODIURIL) 25 MG tablet Take 25 mg by mouth daily.   11/14/2014 at Unknown time  . nebivolol (BYSTOLIC) 10 MG tablet Take 5 mg by mouth 2 (two) times daily.   11/15/2014 at Unknown time  . omeprazole (PRILOSEC) 20 MG capsule Take 20 mg by mouth daily.   11/15/2014 at Unknown time  . potassium chloride (K-DUR) 10 MEQ tablet Take 10 mEq by mouth daily.   11/14/2014 at Unknown time  . venlafaxine XR (EFFEXOR-XR) 150 MG 24 hr capsule Take 150 mg by mouth daily.   11/14/2014 at Unknown time    ALLERGIES:   Allergies  Allergen Reactions  . Benazepril Hcl Anaphylaxis  . Cefadroxil Anaphylaxis  . Ciprofloxacin Hcl Anaphylaxis  . Clonidine Derivatives Anaphylaxis  . Septra [Sulfamethoxazole-Trimethoprim] Anaphylaxis    REVIEW OF SYSTEMS:  A comprehensive review of systems was negative.   FAMILY HISTORY:  History reviewed. No pertinent family history.  SOCIAL HISTORY:   History  Substance Use Topics  . Smoking status: Current Every Day Smoker -- 0.50 packs/day for 45 years  . Smokeless tobacco: Not on file  . Alcohol Use: No     EXAMINATION:  Vital signs in last 24 hours: Temp:  [  97 F (36.1 C)] 97 F (36.1 C) (07/25 0617) Pulse Rate:  [57] 57 (07/25 0617) Resp:  [20] 20 (07/25 0617) BP: (145)/(76) 145/76 mmHg (07/25 0617) SpO2:  [98 %] 98 % (07/25 0617)  General appearance: alert, cooperative and no distress Lungs: clear to auscultation bilaterally Heart: regular rate and rhythm, S1, S2 normal, no murmur, click, rub or gallop Abdomen: soft, non-tender; bowel sounds normal; no masses,  no organomegaly Extremities: extremities normal, atraumatic, no cyanosis or edema Pulses: 2+ and symmetric Skin: Skin color, texture, turgor normal. No rashes or lesions Neurologic: Alert and oriented X 3, normal strength and tone. Normal symmetric reflexes. Normal coordination and gait  Musculoskeletal:  ROM 0-115, Ligaments intact,  Imaging Review Plain  radiographs demonstrate severe degenerative joint disease of the left knee. The overall alignment is significant varus. The bone quality appears to be good for age and reported activity level.  Assessment/Plan: Primary osteoarthritis, left knee   The patient history, physical examination and imaging studies are consistent with advanced degenerative joint disease of the left knee. The patient has failed conservative treatment.  The clearance notes were reviewed.  After discussion with the patient it was felt that Total Knee Replacement was indicated. The procedure,  risks, and benefits of total knee arthroplasty were presented and reviewed. The risks including but not limited to aseptic loosening, infection, blood clots, vascular injury, stiffness, patella tracking problems complications among others were discussed. The patient acknowledged the explanation, agreed to proceed with the plan.  Norbert Malkin 11/15/2014, 7:05 AM

## 2014-11-15 NOTE — Progress Notes (Signed)
Physical Therapy Treatment Patient Details Name: Richard Choi MRN: [id number] DOB: Jul 09, 1945 Today's Date: 11/15/2014    History of Present Illness Pt is a 69 y/o M s/p L TKA.  Pt's PMH includes HTN, SOB w/ ambulation, depression, gout, CAD.    PT Comments    Pt is making good progress w/ PT and was able to tolerate progression of therapeutic exercises this session.  Min guard assist for sit<>stand and ambulation.  Patient needs to practice stairs next session. Pt will benefit from continued skilled PT services to increase functional independence and safety.   Follow Up Recommendations  Home health PT     Equipment Recommendations  None recommended by PT    Recommendations for Other Services OT consult     Precautions / Restrictions Precautions Precautions: Fall;Knee Precaution Booklet Issued: Yes (comment) Precaution Comments: Reviewed no pillow under knee Restrictions Weight Bearing Restrictions: Yes LLE Weight Bearing: Weight bearing as tolerated    Mobility  Bed Mobility               General bed mobility comments: in recliner  Transfers Overall transfer level: Needs assistance Equipment used: Rolling walker (2 wheeled) Transfers: Sit to/from Stand Sit to Stand: Min guard         General transfer comment: Min guard for safety.  No VCs for technique, good carryover from previous therapy sessions.  Ambulation/Gait Ambulation/Gait assistance: Min guard Ambulation Distance (Feet): 55 Feet Assistive device: Rolling walker (2 wheeled) Gait Pattern/deviations: Antalgic;Step-to pattern;Decreased weight shift to left;Decreased stance time - left;Decreased stride length   Gait velocity interpretation: Below normal speed for age/gender General Gait Details: Min guard for safety.  Dec L knee flexion during swing phase and stance phase.  Cues to relax shoulders.   Stairs            Wheelchair Mobility    Modified Rankin (Stroke Patients Only)        Balance Overall balance assessment: Needs assistance Sitting-balance support: No upper extremity supported;Feet supported Sitting balance-Leahy Scale: Good     Standing balance support: Bilateral upper extremity supported;During functional activity Standing balance-Leahy Scale: Fair Standing balance comment: Pt able to doff L sock from foot while seated edge of chair with min guard assist.                     Cognition Arousal/Alertness: Awake/alert Behavior During Therapy: WFL for tasks assessed/performed Overall Cognitive Status: Within Functional Limits for tasks assessed                      Exercises Total Joint Exercises Ankle Circles/Pumps: AROM;Both;10 reps;Seated Long Arc Quad: AROM;Left;5 reps;Seated Knee Flexion: AROM;Left;5 reps;Standing Goniometric ROM: 11-117 Marching in Standing: AROM;Both;10 reps;Standing    General Comments        Pertinent Vitals/Pain Pain Assessment: 0-10 Pain Score: 7  Pain Location: L knee Pain Descriptors / Indicators: Aching;Grimacing Pain Intervention(s): Limited activity within patient's tolerance;Monitored during session;Repositioned;Patient requesting pain meds-RN notified    Home Living Family/patient expects to be discharged to:: Private residence Living Arrangements: Spouse/significant other Available Help at Discharge: Family;Available 24 hours/day Type of Home: Apartment Home Access: Stairs to enter Entrance Stairs-Rails: Right Home Layout: One level Home Equipment: Walker - 2 wheels;Bedside commode      Prior Function Level of Independence: Independent          PT Goals (current goals can now be found in the care plan section) Acute Rehab PT Goals Patient Stated  Goal: to get stronger so he can go home PT Goal Formulation: With patient/family Time For Goal Achievement: 11/22/14 Potential to Achieve Goals: Good Progress towards PT goals: Progressing toward goals    Frequency   7X/week    PT Plan Current plan remains appropriate    Co-evaluation             End of Session Equipment Utilized During Treatment: Gait belt Activity Tolerance: Patient tolerated treatment well;Patient limited by pain Patient left: in chair;with call bell/phone within reach;with family/visitor present     Time: 1610-9604 PT Time Calculation (min) (ACUTE ONLY): 14 min  Charges:  $Gait Training: 8-22 mins                    G Codes:      Richard Choi PT, [place] [phone] Pager: [phone] 11/15/2014, 4:04 PM

## 2014-11-15 NOTE — Op Note (Signed)
TOTAL KNEE REPLACEMENT OPERATIVE NOTE:  11/15/2014  2:51 PM  PATIENT:  Richard Choi  69 y.o. male  PRE-OPERATIVE DIAGNOSIS:  primary osteoarthritis left knee  POST-OPERATIVE DIAGNOSIS:  primary osteoarthritis left knee  PROCEDURE:  Procedure(s): TOTAL KNEE ARTHROPLASTY  SURGEON:  Surgeon(s): Dannielle Huh, MD  PHYSICIAN ASSISTANT: Altamese Cabal, [facility]  ANESTHESIA:   spinal  DRAINS: Hemovac  SPECIMEN: None  COUNTS:  Correct  TOURNIQUET:   Total Tourniquet Time Documented: Thigh (Left) - 81 minutes Total: Thigh (Left) - 81 minutes   DICTATION:  Indication for procedure:    The patient is a 68 y.o. male who has failed conservative treatment for primary osteoarthritis left knee.  Informed consent was obtained prior to anesthesia. The risks versus benefits of the operation were explain and in a way the patient can, and did, understand.   On the implant demand matching protocol, this patient scored 10.  Therefore, this patient did" "did not receive a polyethylene insert with vitamin E which is a high demand implant.  Description of procedure:     The patient was taken to the operating room and placed under anesthesia.  The patient was positioned in the usual fashion taking care that all body parts were adequately padded and/or protected.  I foley catheter was not placed.  A tourniquet was applied and the leg prepped and draped in the usual sterile fashion.  The extremity was exsanguinated with the esmarch and tourniquet inflated to 350 mmHg.  Pre-operative range of motion was normal.  The knee was in 5 degree of significant varus.  A midline incision approximately 6-7 inches long was made with a #10 blade.  A new blade was used to make a parapatellar arthrotomy going 2-3 cm into the quadriceps tendon, over the patella, and alongside the medial aspect of the patellar tendon.  A synovectomy was then performed with the #10 blade and forceps. I then elevated the deep MCL off the  medial tibial metaphysis subperiosteally around to the semimembranosus attachment.    I everted the patella and used calipers to measure patellar thickness.  I used the reamer to ream down to appropriate thickness to recreate the native thickness.  I then removed excess bone with the rongeur and sagittal saw.  I used the appropriately sized template and drilled the three lug holes.  I then put the trial in place and measured the thickness with the calipers to ensure recreation of the native thickness.  The trial was then removed and the patella subluxed and the knee brought into flexion.  A homan retractor was place to retract and protect the patella and lateral structures.  A Z-retractor was place medially to protect the medial structures.  The extra-medullary alignment system was used to make cut the tibial articular surface perpendicular to the anamotic axis of the tibia and in 3 degrees of posterior slope.  The cut surface and alignment jig was removed. I used patient specific guides from [employer] with disposble instrumentation.  The posterior condylar axis measured 3 degrees.  I then used the anterior referencing sizer and measured the femur to be a size 5.  The 4-In-1 cutting block was screwed into place in external rotation matching the posterior condylar angle, making our cuts perpendicular to the epicondylar axis.  Anterior, posterior and chamfer cuts were made with the sagittal saw.  The cutting block and cut pieces were removed.  A lamina spreader was placed in 90 degrees of flexion.  The ACL, PCL, menisci, and posterior  condylar osteophytes were removed.  A 10 mm spacer blocked was found to offer good flexion and extension gap balance after moderate in degree releasing.   The scoop retractor was then placed and the femoral finishing block was pinned in place.  The small sagittal saw was used as well as the lug drill to finish the femur.  The block and cut surfaces were removed and the medullary  canal hole filled with autograft bone from the cut pieces.  The tibia was delivered forward in deep flexion and external rotation.  A size 5  tray was selected and pinned into place centered on the medial 1/3 of the tibial tubercle.  The reamer and keel was used to prepare the tibia through the tray.    I then trialed with the size 5 femur, size 5 tibia, a 10 mm insert and the 3 patella.  I had excellent flexion/extension gap balance, excellent patella tracking.  Flexion was full and beyond 120 degrees; extension was zero.  These components were chosen and the staff opened them to me on the back table while the knee was lavaged copiously and the cement mixed.  The soft tissue was infiltrated with 60cc of exparel 1.3% through a 21 gauge needle.  I cemented in the components and removed all excess cement.  The polyethylene tibial component was snapped into place and the knee placed in extension while cement was hardening.  The capsule was infilltrated with 30cc of .25% Marcaine with epinephrine.  A hemovac was place in the joint exiting superolaterally.  A pain pump was place superomedially superficial to the arthrotomy.  Once the cement was hard, the tourniquet was let down.  Hemostasis was obtained.  The arthrotomy was closed with figure-8 #1 vicryl sutures.  The deep soft tissues were closed with #0 vicryls and the subcuticular layer closed with a running #2-0 vicryl.  The skin was reapproximated and closed with skin staples.  The wound was dressed with xeroform, 4 x4's, 2 ABD sponges, a single layer of webril and a TED stocking.   The patient was then awakened, extubated, and taken to the recovery room in stable condition.  BLOOD LOSS:  300cc DRAINS: 1 hemovac, 1 pain catheter COMPLICATIONS:  None.  PLAN OF CARE: Admit to inpatient   PATIENT DISPOSITION:  PACU - hemodynamically stable.   Delay start of Pharmacological VTE agent (>24hrs) due to surgical blood loss or risk of bleeding:  not  applicable  Please fax a copy of this op note to my office at [phone] (please only include page 1 and 2 of the Case Information op note)

## 2014-11-15 NOTE — Anesthesia Preprocedure Evaluation (Addendum)
Anesthesia Evaluation  Patient identified by MRN, date of birth, ID band Patient awake    Reviewed: Allergy & Precautions, NPO status , Patient's Chart, lab work & pertinent test results, reviewed documented beta blocker date and time   Airway Mallampati: II  TM Distance: >3 FB Neck ROM: Full    Dental   Pulmonary Current Smoker,  breath sounds clear to auscultation        Cardiovascular hypertension, Pt. on medications and Pt. on home beta blockers + CAD and + Cardiac Stents Rhythm:Regular Rate:Normal     Neuro/Psych Depression negative neurological ROS     GI/Hepatic Neg liver ROS, PUD,   Endo/Other  negative endocrine ROS  Renal/GU CRFRenal disease     Musculoskeletal  (+) Arthritis -,   Abdominal   Peds  Hematology negative hematology ROS (+)   Anesthesia Other Findings   Reproductive/Obstetrics                            Lab Results  Component Value Date   WBC 8.6 11/11/2014   HGB 15.9 11/11/2014   HCT 44.6 11/11/2014   MCV 88.8 11/11/2014   PLT 224 11/11/2014   Lab Results  Component Value Date   CREATININE 1.50* 11/11/2014   BUN 14 11/11/2014   NA 136 11/11/2014   K 3.3* 11/11/2014   CL 101 11/11/2014   CO2 26 11/11/2014    Anesthesia Physical Anesthesia Plan  ASA: III  Anesthesia Plan: Spinal and MAC   Post-op Pain Management:    Induction: Intravenous  Airway Management Planned: Simple Face Mask and Natural Airway  Additional Equipment:   Intra-op Plan:   Post-operative Plan:   Informed Consent: I have reviewed the patients History and Physical, chart, labs and discussed the procedure including the risks, benefits and alternatives for the proposed anesthesia with the patient or authorized representative who has indicated his/her understanding and acceptance.     Plan Discussed with: CRNA  Anesthesia Plan Comments:        Anesthesia Quick  Evaluation

## 2014-11-15 NOTE — Evaluation (Signed)
Physical Therapy Evaluation Patient Details Name: Richard Choi MRN: [id number] DOB: [birth year] Today's Date: 11/15/2014   History of Present Illness  Pt is a 68 y/o M s/p L TKA.  Pt's PMH includes HTN, SOB w/ ambulation, depression, gout, CAD.  Clinical Impression  Pt is s/p L TKA resulting in the deficits listed below (see PT Problem List). Min guard assist w/ ambulating 60 ft w/ RW, cues provided for safe technique sit<>stand. Pt will benefit from skilled PT to increase their independence and safety with mobility to allow discharge to the venue listed below w/ wife available for assist 24/7.     Follow Up Recommendations Home health PT    Equipment Recommendations  None recommended by PT    Recommendations for Other Services OT consult     Precautions / Restrictions Precautions Precautions: Fall;Knee Precaution Booklet Issued: Yes (comment) Precaution Comments: Reviewed no pillow under knee Restrictions Weight Bearing Restrictions: Yes LLE Weight Bearing: Weight bearing as tolerated      Mobility  Bed Mobility               General bed mobility comments: sitting EOB w/ nurse tech in room upon arrival  Transfers Overall transfer level: Needs assistance Equipment used: Rolling walker (2 wheeled) Transfers: Sit to/from Stand Sit to Stand: Min assist         General transfer comment: Min assist as pt attempts to stand by pulling on RW.  Cues for hand placement for safety during sit<>stand and pt performed stand>sit w/ min guard assist w/ proper technique.  Ambulation/Gait Ambulation/Gait assistance: Min guard Ambulation Distance (Feet): 60 Feet Assistive device: Rolling walker (2 wheeled) Gait Pattern/deviations: Step-to pattern;Antalgic;Trunk flexed;Decreased stride length;Decreased stance time - left   Gait velocity interpretation: Below normal speed for age/gender General Gait Details: Cues to stand upright and correct management of RW.    Stairs             Wheelchair Mobility    Modified Rankin (Stroke Patients Only)       Balance Overall balance assessment: Needs assistance Sitting-balance support: No upper extremity supported;Feet supported Sitting balance-Leahy Scale: Good     Standing balance support: During functional activity;No upper extremity supported Standing balance-Leahy Scale: Fair Standing balance comment: Pt able to assist w/ pulling up shorts in standing w/o either UE supported on RW                             Pertinent Vitals/Pain Pain Assessment: 0-10 Pain Score: 7  Pain Location: L knee Pain Descriptors / Indicators: Aching;Discomfort;Grimacing Pain Intervention(s): Limited activity within patient's tolerance;Monitored during session;Repositioned    Home Living Family/patient expects to be discharged to:: Private residence Living Arrangements: Spouse/significant other Available Help at Discharge: Family;Available 24 hours/day (wife) Type of Home: Apartment Home Access: Stairs to enter Entrance Stairs-Rails: Right Entrance Stairs-Number of Steps: 3 Home Layout: One level Home Equipment: Walker - 2 wheels;Bedside commode      Prior Function Level of Independence: Independent               Hand Dominance        Extremity/Trunk Assessment   Upper Extremity Assessment: Defer to OT evaluation           Lower Extremity Assessment: LLE deficits/detail   LLE Deficits / Details: weakness and limited ROM s/p L TKA     Communication   Communication: No difficulties  Cognition Arousal/Alertness: Awake/alert Behavior During  Therapy: WFL for tasks assessed/performed Overall Cognitive Status: Within Functional Limits for tasks assessed                      General Comments      Exercises Total Joint Exercises Ankle Circles/Pumps: AROM;Both;10 reps;Seated Long Arc Quad: AROM;Left;5 reps;Seated Knee Flexion: AROM;Left;5 reps;Seated Goniometric ROM:  11-117      Assessment/Plan    PT Assessment Patient needs continued PT services  PT Diagnosis Difficulty walking;Abnormality of gait;Generalized weakness;Acute pain   PT Problem List Decreased strength;Decreased range of motion;Decreased activity tolerance;Decreased balance;Decreased mobility;Decreased coordination;Decreased knowledge of use of DME;Decreased safety awareness;Decreased knowledge of precautions;Pain;Decreased skin integrity  PT Treatment Interventions DME instruction;Gait training;Stair training;Functional mobility training;Therapeutic activities;Therapeutic exercise;Balance training;Neuromuscular re-education;Modalities;Patient/family education   PT Goals (Current goals can be found in the Care Plan section) Acute Rehab PT Goals Patient Stated Goal: to get stronger so he can go home PT Goal Formulation: With patient/family Time For Goal Achievement: 11/22/14 Potential to Achieve Goals: Good    Frequency 7X/week   Barriers to discharge Inaccessible home environment 3 steps to enter home    Co-evaluation               End of Session Equipment Utilized During Treatment: Gait belt Activity Tolerance: Patient tolerated treatment well;Patient limited by pain Patient left: in chair;with call bell/phone within reach;with family/visitor present Nurse Communication: Mobility status;Precautions;Weight bearing status         Time: 1340-1412 PT Time Calculation (min) (ACUTE ONLY): 32 min   Charges:   PT Evaluation $Initial PT Evaluation Tier I: 1 Procedure PT Treatments $Gait Training: 8-22 mins   PT G CodesMichail Jewels PT, DPT [phone] Pager: [phone] 11/15/2014, 2:20 PM

## 2014-11-15 NOTE — Anesthesia Procedure Notes (Signed)
Spinal Patient location during procedure: OR Start time: 11/15/2014 7:30 AM End time: 11/15/2014 7:40 AM Staffing Anesthesiologist: Suzette Battiest Performed by: anesthesiologist  Preanesthetic Checklist Completed: patient identified, site marked, surgical consent, pre-op evaluation, timeout performed, IV checked, risks and benefits discussed and monitors and equipment checked Spinal Block Patient position: sitting Prep: Betadine Patient monitoring: heart rate, continuous pulse ox and blood pressure Injection technique: single-shot Needle Needle type: Quincke  Needle gauge: 22 G Needle length: 9 cm Additional Notes Expiration date of kit checked and confirmed. Patient tolerated procedure well, without complications. Free flowing CSF but unable to aspirate CSF with 24g spinocan. Switched to Purdin with aspiration CSF before and after injection.

## 2014-11-15 NOTE — Anesthesia Postprocedure Evaluation (Signed)
  Anesthesia Post-op Note  Patient: Richard Choi  Procedure(s) Performed: Procedure(s): TOTAL KNEE ARTHROPLASTY (Left)  Patient Location: PACU  Anesthesia Type:MAC and Spinal  Level of Consciousness: awake, alert  and oriented  Airway and Oxygen Therapy: Patient Spontanous Breathing  Post-op Pain: none  Post-op Assessment: Post-op Vital signs reviewed. Moving BLE including knee flexion and extension.         L Sensory Level: S1-Sole of foot, small toes R Sensory Level: S1-Sole of foot, small toes  Post-op Vital Signs: Reviewed  Last Vitals:  Filed Vitals:   11/15/14 1048  BP: 142/63  Pulse: 64  Temp: 36.4 C  Resp: 16    Complications: No apparent anesthesia complications

## 2014-11-15 NOTE — Progress Notes (Signed)
Occupational Therapy Evaluation Patient Details Name: Richard Choi MRN: [id number] DOB: 28-Jun-1945 Today's Date: 11/15/2014    History of Present Illness Pt is a 69 y/o M s/p L TKA.  Pt's PMH includes HTN, SOB w/ ambulation, depression, gout, CAD.   Clinical Impression   Patient presenting with decreased self care, decreased balance, decreased functional mobility/transfer, increased pain, and decreased safety. Patient I  PTA. Patient currently functioning mod A LB ADLs and set up A for UB ADLs. Patient will benefit from acute OT to increase overall independence in the areas of ADLs, functional mobility, safety, and education in order to safely discharge home.    Follow Up Recommendations  No OT follow up    Equipment Recommendations  Tub/shower bench    Recommendations for Other Services  (none)     Precautions / Restrictions Precautions Precautions: Fall;Knee Precaution Booklet Issued: Yes (comment) Precaution Comments: Reviewed no pillow under knee Restrictions Weight Bearing Restrictions: Yes LLE Weight Bearing: Weight bearing as tolerated      Mobility Bed Mobility               General bed mobility comments: in recliner  Transfers Overall transfer level: Needs assistance Equipment used: Rolling walker (2 wheeled) Transfers: Sit to/from Stand Sit to Stand: Min guard         General transfer comment: Min guard for safety.  No VCs for technique, good carryover from previous therapy sessions.    Balance Overall balance assessment: Needs assistance Sitting-balance support: No upper extremity supported;Feet supported Sitting balance-Leahy Scale: Good     Standing balance support: Bilateral upper extremity supported;During functional activity Standing balance-Leahy Scale: Fair Standing balance comment: Pt able to doff L sock from foot while seated edge of chair with min guard assist.                             ADL Overall ADL's :  Needs assistance/impaired                                       General ADL Comments: Pt requiring Mod A for LB self care secondary to pt unable to reach feet to thread clothing at this time. Pt requires min A balance during LB clothing management and ambulation with RW. Set up for UB self care.      Vision     Perception     Praxis      Pertinent Vitals/Pain Pain Assessment: 0-10 Pain Score: 7  Pain Location: L knee Pain Descriptors / Indicators: Aching;Grimacing Pain Intervention(s): Limited activity within patient's tolerance;Monitored during session;Repositioned;Patient requesting pain meds-RN notified     Hand Dominance     Extremity/Trunk Assessment Upper Extremity Assessment Upper Extremity Assessment: Overall WFL for tasks assessed   Lower Extremity Assessment Lower Extremity Assessment: Defer to PT evaluation LLE Deficits / Details: weakness and limited ROM s/p L TKA LLE Sensation:  (WNL)       Communication Communication Communication: No difficulties   Cognition Arousal/Alertness: Awake/alert Behavior During Therapy: WFL for tasks assessed/performed Overall Cognitive Status: Within Functional Limits for tasks assessed                     General Comments               Home Living Family/patient expects to be discharged to:: Private residence  Living Arrangements: Spouse/significant other Available Help at Discharge: Family;Available 24 hours/day Type of Home: Apartment Home Access: Stairs to enter Entrance Stairs-Number of Steps: 3 Entrance Stairs-Rails: Right Home Layout: One level     Bathroom Shower/Tub: Curtain;Tub/shower unit Shower/tub characteristics: Engineer, building services: Standard Bathroom Accessibility: Yes How Accessible: Accessible via walker Home Equipment: Walker - 2 wheels;Bedside commode          Prior Functioning/Environment Level of Independence: Independent             OT Diagnosis:  Acute pain;Generalized weakness   OT Problem List: Decreased activity tolerance;Impaired balance (sitting and/or standing);Decreased safety awareness;Decreased knowledge of use of DME or AE;Pain   OT Treatment/Interventions: Self-care/ADL training;Balance training;Therapeutic exercise;Therapeutic activities;Energy conservation;DME and/or AE instruction;Patient/family education    OT Goals(Current goals can be found in the care plan section) Acute Rehab OT Goals Patient Stated Goal: to get stronger so he can go home OT Goal Formulation: With patient/family Time For Goal Achievement: 11/29/14 Potential to Achieve Goals: Good ADL Goals Pt Will Perform Lower Body Bathing: with modified independence;sit to/from stand;with adaptive equipment Pt Will Perform Lower Body Dressing: with modified independence;with adaptive equipment;sit to/from stand Pt Will Transfer to Toilet: with modified independence Pt Will Perform Tub/Shower Transfer: with modified independence;ambulating;tub bench  OT Frequency: Min 2X/week   Barriers to D/C:  (none at this time)             End of Session Equipment Utilized During Treatment: Rolling walker CPM Left Knee CPM Left Knee: Off Left Knee Flexion (Degrees): 90 Left Knee Extension (Degrees): 0  Activity Tolerance: Patient tolerated treatment well;Other (comment) (pt reports increased pain when standing and ambulating during session) Patient left: in chair;with call bell/phone within reach;with family/visitor present   Time: 4098-1191 OT Time Calculation (min): 18 min Charges:  OT General Charges $OT Visit: 1 Procedure OT Evaluation $Initial OT Evaluation Tier I: 1 Procedure G-Codes:    Lowella Grip 2014-12-04, 4:05 PM

## 2014-11-16 ENCOUNTER — Encounter (HOSPITAL_COMMUNITY): Payer: Self-pay | Admitting: Orthopedic Surgery

## 2014-11-16 LAB — CBC
HEMATOCRIT: 41.1 % (ref 39.0–52.0)
Hemoglobin: 14.1 g/dL (ref 13.0–17.0)
MCH: 31.3 pg (ref 26.0–34.0)
MCHC: 34.3 g/dL (ref 30.0–36.0)
MCV: 91.3 fL (ref 78.0–100.0)
Platelets: 221 10*3/uL (ref 150–400)
RBC: 4.5 MIL/uL (ref 4.22–5.81)
RDW: 14.2 % (ref 11.5–15.5)
WBC: 13.8 10*3/uL — AB (ref 4.0–10.5)

## 2014-11-16 LAB — BASIC METABOLIC PANEL
Anion gap: 8 (ref 5–15)
BUN: 14 mg/dL (ref 6–20)
CALCIUM: 8.4 mg/dL — AB (ref 8.9–10.3)
CO2: 27 mmol/L (ref 22–32)
CREATININE: 1.59 mg/dL — AB (ref 0.61–1.24)
Chloride: 101 mmol/L (ref 101–111)
GFR calc Af Amer: 50 mL/min — ABNORMAL LOW (ref >60)
GFR calc non Af Amer: 43 mL/min — ABNORMAL LOW (ref >60)
GLUCOSE: 119 mg/dL — AB (ref 65–99)
POTASSIUM: 3.5 mmol/L (ref 3.5–5.1)
SODIUM: 136 mmol/L (ref 135–145)

## 2014-11-16 MED ORDER — OXYCODONE HCL ER 10 MG PO T12A: 10.0000 mg | EXTENDED_RELEASE_TABLET | Freq: Two times a day (BID) | ORAL | Status: DC

## 2014-11-16 MED ORDER — CYCLOBENZAPRINE HCL 10 MG PO TABS: 10.0000 mg | ORAL_TABLET | Freq: Every day | ORAL | Status: DC

## 2014-11-16 MED ORDER — ENOXAPARIN SODIUM 40 MG/0.4ML ~~LOC~~ SOLN: 40.0000 mg | SUBCUTANEOUS | Status: DC

## 2014-11-16 MED ORDER — OXYCODONE HCL 5 MG PO TABS: 5.0000 mg | ORAL_TABLET | ORAL | Status: DC | PRN

## 2014-11-16 NOTE — Care Management Note (Signed)
Case Management Note  Patient Details  Name: Richard Choi MRN: [id number] Date of Birth: 1945-08-25  Subjective/Objective:   69 yr old male s/p left total knee arthroplasty.                 Action/Plan: Case manager spoke with patient and wife concerning home health and DME needs. Patient was preoperatively setup with [facility], but they don't work with their insurance. Choice was offered, referral was called to [place], [facility]. DME has been delivered to patient's home.   Expected Discharge Date:   11/16/14               Expected Discharge Plan:   Home with Home Health  In-House Referral:  NA  Discharge planning Services  CM Consult  Post Acute Care Choice:  Home Health, Durable Medical Equipment Choice offered to:  Patient, Spouse  DME Arranged:  3-N-1, CPM, Walker rolling DME Agency:  [employer]  HH Arranged:  PT HH Agency:  [facility]  Status of Service:  Completed, signed off  Medicare Important Message Given:    Date Medicare IM Given:    Medicare IM give by:    Date Additional Medicare IM Given:    Additional Medicare Important Message give by:     If discussed at Long Length of Stay Meetings, dates discussed:    Additional Comments:  Durenda Guthrie, RN 11/16/2014, 2:07 PM

## 2014-11-16 NOTE — Discharge Instructions (Signed)

## 2014-11-16 NOTE — Progress Notes (Signed)
Changed dressing on L knee. D/t bleeding from removal of hemovac

## 2014-11-16 NOTE — Progress Notes (Signed)
Physical Therapy Treatment Patient Details Name: Richard Choi MRN: [id number] DOB: 09-17-1945 Today's Date: 11/16/2014    History of Present Illness Pt is a 69 y/o M s/p L TKA.  Pt's PMH includes HTN, SOB w/ ambulation, depression, gout, CAD.    PT Comments    Patient is making good progress with PT.  From a mobility standpoint anticipate patient will be ready for DC home today.  Pt will benefit from continued skilled PT services to increase functional independence and safety.     Follow Up Recommendations  Home health PT     Equipment Recommendations  None recommended by PT    Recommendations for Other Services       Precautions / Restrictions Precautions Precautions: Fall;Knee Precaution Comments: Reviewed no pillow under knee Restrictions Weight Bearing Restrictions: Yes LLE Weight Bearing: Weight bearing as tolerated    Mobility  Bed Mobility               General bed mobility comments: sitting EOB upon arrival  Transfers Overall transfer level: Needs assistance Equipment used: Rolling walker (2 wheeled) Transfers: Sit to/from Stand Sit to Stand: Min guard         General transfer comment: Min guard for safety.  Cues to scoot toward edge of chair prior to standing.  Ambulation/Gait Ambulation/Gait assistance: Min guard Ambulation Distance (Feet): 60 Feet Assistive device: Rolling walker (2 wheeled) Gait Pattern/deviations: Step-to pattern;Step-through pattern;Antalgic;Decreased weight shift to left;Decreased stride length;Decreased stance time - left   Gait velocity interpretation: Below normal speed for age/gender General Gait Details: Min guard for safety.  Upright posture and dec reliance on RW for support.   Stairs Stairs: Yes Stairs assistance: Min guard Stair Management: One rail Right;Step to pattern;Sideways Number of Stairs: 2 General stair comments: Demonstration and VCs for proper technique, wife present.    Wheelchair  Mobility    Modified Rankin (Stroke Patients Only)       Balance Overall balance assessment: Needs assistance Sitting-balance support: No upper extremity supported;Feet supported Sitting balance-Leahy Scale: Good     Standing balance support: Bilateral upper extremity supported;During functional activity Standing balance-Leahy Scale: Fair                      Cognition Arousal/Alertness: Awake/alert Behavior During Therapy: WFL for tasks assessed/performed Overall Cognitive Status: Within Functional Limits for tasks assessed                      Exercises Total Joint Exercises Long Arc Quad: AROM;Left;5 reps;Seated Knee Flexion: AROM;Left;5 reps;Standing Goniometric ROM: 109 L knee flexion    General Comments        Pertinent Vitals/Pain Pain Assessment: 0-10 Pain Score: 7  Pain Location: L knee Pain Descriptors / Indicators: Aching Pain Intervention(s): Limited activity within patient's tolerance;Monitored during session;Repositioned    Home Living                      Prior Function            PT Goals (current goals can now be found in the care plan section) Acute Rehab PT Goals Patient Stated Goal: to get stronger so he can go home PT Goal Formulation: With patient/family Time For Goal Achievement: 11/22/14 Potential to Achieve Goals: Good Progress towards PT goals: Progressing toward goals    Frequency  7X/week    PT Plan Current plan remains appropriate    Co-evaluation  End of Session Equipment Utilized During Treatment: Gait belt Activity Tolerance: Patient tolerated treatment well Patient left: in chair;with family/visitor present (in therapy gym w/ wife, OT to meet pt in gym for session)     Time: 1610-9604 PT Time Calculation (min) (ACUTE ONLY): 23 min  Charges:  $Gait Training: 23-37 mins                    G Codes:      Michail Jewels PT, [place] [phone] Pager: [phone] 11/16/2014, 1:28  PM

## 2014-11-16 NOTE — Progress Notes (Signed)
Occupational Therapy Treatment Patient Details Name: Richard Choi MRN: [id number] DOB: 1945-05-28 Today's Date: 11/16/2014    History of present illness Pt is a 69 y/o M s/p L TKA.  Pt's PMH includes HTN, SOB w/ ambulation, depression, gout, CAD.   OT comments  Patient progressing towards OT goals, continue plan of care for now. Pt performed tub/shower transfer using BSC with mod verbal cues. Wife present and can independently assist him with this transfer. Suspect difficulty follow multiple step commands is due to medication?    Follow Up Recommendations  No OT follow up;Supervision - Intermittent    Equipment Recommendations  None recommended by OT (pt states he has a BSC at home)    Recommendations for Other Services  None at this time   Precautions / Restrictions Precautions Precautions: Fall;Knee Precaution Comments: Reviewed no pillow under knee and zero knee foam Restrictions Weight Bearing Restrictions: Yes LLE Weight Bearing: Weight bearing as tolerated    Mobility Bed Mobility General bed mobility comments: in recliner  Transfers Overall transfer level: Needs assistance Equipment used: Rolling walker (2 wheeled) Transfers: Sit to/from Stand Sit to Stand: Min guard General transfer comment: Min guard for safety. Cues needed for hand placement and overall safety.    Balance Overall balance assessment: Needs assistance Sitting-balance support: No upper extremity supported;Feet supported Sitting balance-Leahy Scale: Good     Standing balance support: Bilateral upper extremity supported;During functional activity Standing balance-Leahy Scale: Fair   ADL Overall ADL's : Needs assistance/impaired General ADL Comments: Pt continues to need assistance with LB ADLs. Encouraged patient to work on reaching BLEs to increase independence with this. Pt performed tub/shower transfer using BSC with wife present. Pt took extra time to follow multiple step commands, suspect  this is due to medication?      Cognition   Behavior During Therapy: WFL for tasks assessed/performed Overall Cognitive Status: Within Functional Limits for tasks assessed (difficulty following multiple step commands, suspect due to medication?)                 Pertinent Vitals/ Pain       Pain Assessment: 0-10 Pain Score: 7  Pain Location: left knee Pain Descriptors / Indicators: Aching Pain Intervention(s): Monitored during session;Repositioned   Frequency Min 2X/week     Progress Toward Goals  OT Goals(current goals can now befound in the care plan section)  Progress towards OT goals: Progressing toward goals     Plan Discharge plan remains appropriate    End of Session Equipment Utilized During Treatment: Rolling walker   Activity Tolerance Patient tolerated treatment well   Patient Left in chair;with call bell/phone within reach;with family/visitor present    Time: 4098-1191 OT Time Calculation (min): 19 min  Charges: OT General Charges $OT Visit: 1 Procedure OT Treatments $Self Care/Home Management : 8-22 mins  Richard Choi , MS, OTR/L, CLT Pager: [phone]  11/16/2014, 12:59 PM

## 2014-11-16 NOTE — Progress Notes (Signed)
SPORTS MEDICINE AND JOINT REPLACEMENT  Georgena Spurling, MD   Altamese Cabal, PA-C [address] [place], [place], [place]  [address]                             [phone]   PROGRESS NOTE  Subjective:  negative for Chest Pain  negative for Shortness of Breath  negative for Nausea/Vomiting   negative for Calf Pain  negative for Bowel Movement   Tolerating Diet: yes         Patient reports pain as 5 on 0-10 scale.    Objective: Vital signs in last 24 hours:   Patient Vitals for the past 24 hrs:  BP Temp Temp src Pulse Resp SpO2  11/16/14 0536 (!) 146/79 mmHg 97.7 F (36.5 C) - 81 16 99 %  11/16/14 0143 135/83 mmHg 98.5 F (36.9 C) - 83 16 96 %  11/15/14 2003 106/72 mmHg 98.5 F (36.9 C) - 81 16 97 %  11/15/14 1600 117/67 mmHg 97.6 F (36.4 C) Oral 73 16 95 %  11/15/14 1500 124/62 mmHg 97.7 F (36.5 C) Axillary 67 16 94 %  11/15/14 1400 136/78 mmHg 97.8 F (36.6 C) Axillary 71 16 97 %  11/15/14 1250 (!) 149/74 mmHg 97.5 F (36.4 C) - 65 16 97 %  11/15/14 1205 139/76 mmHg 97.7 F (36.5 C) - 63 15 96 %  11/15/14 1150 (!) 144/73 mmHg - - 63 16 100 %  11/15/14 1148 - - - 61 14 98 %  11/15/14 1048 (!) 142/63 mmHg 97.5 F (36.4 C) - 64 16 97 %  11/15/14 1035 134/64 mmHg - - 63 14 99 %  11/15/14 1030 - - - 62 15 96 %  11/15/14 1018 139/68 mmHg - - 62 16 95 %  11/15/14 1015 - 97.6 F (36.4 C) - 62 15 97 %  11/15/14 1005 117/71 mmHg - - 66 15 100 %  11/15/14 0948 114/68 mmHg - - 67 - 97 %    {1959:LAST@   Intake/Output from previous day:   07/25 0701 - 07/26 0700 In: 1000 [I.V.:1000] Out: 1050 [Urine:400; Drains:650]   Intake/Output this shift:   07/25 1901 - 07/26 0700 In: -  Out: 650 [Urine:400; Drains:250]   Intake/Output      07/25 0701 - 07/26 0700   I.V. 1000   Total Intake 1000   Urine 400   Drains 650   Total Output 1050   Net -50          LABORATORY DATA:  Recent Labs  11/11/14 1012 11/15/14 1623 11/16/14 0519  WBC 8.6 12.6* 13.8*  HGB  15.9 14.6 14.1  HCT 44.6 42.0 41.1  PLT 224 215 221    Recent Labs  11/11/14 1012 11/15/14 1623  NA 136  --   K 3.3*  --   CL 101  --   CO2 26  --   BUN 14  --   CREATININE 1.50* 1.41*  GLUCOSE 105*  --   CALCIUM 9.0  --    Lab Results  Component Value Date   INR 1.03 11/11/2014   INR 0.99 10/28/2014    Examination:  General appearance: alert, cooperative and no distress Extremities: extremities normal, atraumatic, no cyanosis or edema and Homans sign is negative, no sign of DVT  Wound Exam: clean, dry, intact   Drainage:  None: wound tissue dry  Motor Exam: EHL and FHL Intact  Sensory  Exam: Deep Peroneal normal   Assessment:    1 Day Post-Op  Procedure(s) (LRB): TOTAL KNEE ARTHROPLASTY (Left)  ADDITIONAL DIAGNOSIS:  Active Problems:   S/P total knee arthroplasty  Acute Blood Loss Anemia   Plan: Physical Therapy as ordered Weight Bearing as Tolerated (WBAT)  DVT Prophylaxis:  Lovenox  DISCHARGE PLAN: Home  DISCHARGE NEEDS: HHPT, CPM, Walker and 3-in-1 comode seat         Marchel Foote 11/16/2014, 6:55 AM

## 2014-11-18 ENCOUNTER — Encounter (HOSPITAL_COMMUNITY): Payer: Self-pay | Admitting: Orthopedic Surgery

## 2014-12-14 NOTE — Discharge Summary (Signed)
SPORTS MEDICINE & JOINT REPLACEMENT   Georgena Spurling, MD   Altamese Cabal, PA-C [address] [place], [place], [place]  [address]                             [phone]  PATIENT ID: Richard Choi        MRN:  [id number]          DOB/AGE: 1945/06/09 / 69 y.o.    DISCHARGE SUMMARY  ADMISSION DATE:    11/15/2014 DISCHARGE DATE:   11/16/2014  ADMISSION DIAGNOSIS: primary osteoarthritis left knee    DISCHARGE DIAGNOSIS:  primary osteoarthritis left knee    ADDITIONAL DIAGNOSIS: Active Problems:   S/P total knee arthroplasty  Past Medical History  Diagnosis Date  . Hypertension   . Shortness of breath dyspnea     with ambulation  . Depression   . Pancreatitis     approx 7-8 years ago  . Arthritis   . Stomach ulcer   . Gout   . Hyperlipemia   . Coronary artery disease     PROCEDURE: Procedure(s): TOTAL KNEE ARTHROPLASTY on 11/15/2014  CONSULTS:     HISTORY:  See H&P in chart  HOSPITAL COURSE:  Richard Choi is a 69 y.o. admitted on 11/15/2014 and found to have a diagnosis of primary osteoarthritis left knee.  After appropriate laboratory studies were obtained  they were taken to the operating room on 11/15/2014 and underwent Procedure(s): TOTAL KNEE ARTHROPLASTY.   They were given perioperative antibiotics:  Anti-infectives    Start     Dose/Rate Route Frequency Ordered Stop   11/15/14 1900  vancomycin (VANCOCIN) IVPB 1000 mg/200 mL premix     1,000 mg 200 mL/hr over 60 Minutes Intravenous Every 12 hours 11/15/14 1235 11/15/14 1939   11/15/14 0600  vancomycin (VANCOCIN) 1,500 mg in sodium chloride 0.9 % 500 mL IVPB     1,500 mg 250 mL/hr over 120 Minutes Intravenous On call to O.R. 11/14/14 1435 11/15/14 0711    .  Tolerated the procedure well.  Placed with a foley intraoperatively.  Given Ofirmev at induction and for 48 hours.    POD# 1: Vital signs were stable.  Patient denied Chest pain, shortness of breath, or calf pain.  Patient was started on Lovenox  30 mg subcutaneously twice daily at 8am.  Consults to PT, OT, and care management were made.  The patient was weight bearing as tolerated.  CPM was placed on the operative leg 0-90 degrees for 6-8 hours a day.  Incentive spirometry was taught.  Dressing was changed.  Hemovac was discontinued.      Continued  PT for ambulation and exercise program.  IV saline locked.  O2 discontinued.   The remainder of the hospital course was dedicated to ambulation and strengthening.   The patient was discharged on 1 day post op in  Good condition.  Blood products given:none  DIAGNOSTIC STUDIES: Recent vital signs: No data found.      Recent laboratory studies: No results for input(s): WBC, HGB, HCT, PLT in the last 168 hours. No results for input(s): NA, K, CL, CO2, BUN, CREATININE, GLUCOSE, CALCIUM in the last 168 hours. Lab Results  Component Value Date   INR 1.03 11/11/2014   INR 0.99 10/28/2014     Recent Radiographic Studies :  No results found.  DISCHARGE INSTRUCTIONS: Discharge Instructions    CPM    Complete by:  As directed  Continuous passive motion machine (CPM):      Use the CPM from 0 to 90 for 6-8 hours per day.      You may increase by 10 per day.  You may break it up into 2 or 3 sessions per day.      Use CPM for 2 weeks or until you are told to stop.     Call MD / Call 911    Complete by:  As directed   If you experience chest pain or shortness of breath, CALL 911 and be transported to the hospital emergency room.  If you develope a fever above 101 F, pus (white drainage) or increased drainage or redness at the wound, or calf pain, call your surgeon's office.     Change dressing    Complete by:  As directed   Change dressing on Wednesday, then change the dressing daily with sterile 4 x 4 inch gauze dressing and apply TED hose.     Constipation Prevention    Complete by:  As directed   Drink plenty of fluids.  Prune juice may be helpful.  You may use a stool softener, such  as Colace (over the counter) 100 mg twice a day.  Use MiraLax (over the counter) for constipation as needed.     Diet - low sodium heart healthy    Complete by:  As directed      Do not put a pillow under the knee. Place it under the heel.    Complete by:  As directed      Driving restrictions    Complete by:  As directed   No driving for 6 weeks     Increase activity slowly as tolerated    Complete by:  As directed      Lifting restrictions    Complete by:  As directed   No lifting for 6 weeks     TED hose    Complete by:  As directed   Use stockings (TED hose) for 2 weeks on both leg(s).  You may remove them at night for sleeping.           DISCHARGE MEDICATIONS:     Medication List    STOP taking these medications        aspirin EC 81 MG tablet      TAKE these medications        allopurinol 100 MG tablet  Commonly known as:  ZYLOPRIM  Take 100 mg by mouth 2 (two) times daily.     amitriptyline 25 MG tablet  Commonly known as:  ELAVIL  Take 25 mg by mouth daily.     amLODipine 10 MG tablet  Commonly known as:  NORVASC  Take 10 mg by mouth daily.     atorvastatin 40 MG tablet  Commonly known as:  LIPITOR  Take 40 mg by mouth daily.     cyclobenzaprine 10 MG tablet  Commonly known as:  FLEXERIL  Take 1 tablet (10 mg total) by mouth daily.     doxazosin 2 MG tablet  Commonly known as:  CARDURA  Take 2 mg by mouth daily.     enoxaparin 40 MG/0.4ML injection  Commonly known as:  LOVENOX  Inject 0.4 mLs (40 mg total) into the skin daily.     hydrochlorothiazide 25 MG tablet  Commonly known as:  HYDRODIURIL  Take 25 mg by mouth daily.     nebivolol 10 MG tablet  Commonly known as:  BYSTOLIC  Take 5 mg by mouth 2 (two) times daily.     omeprazole 20 MG capsule  Commonly known as:  PRILOSEC  Take 20 mg by mouth daily.     oxyCODONE 5 MG immediate release tablet  Commonly known as:  Oxy IR/ROXICODONE  Take 1-2 tablets (5-10 mg total) by mouth every 3  (three) hours as needed for breakthrough pain.     OxyCODONE 10 mg T12a 12 hr tablet  Commonly known as:  OXYCONTIN  Take 1 tablet (10 mg total) by mouth every 12 (twelve) hours.     potassium chloride 10 MEQ tablet  Commonly known as:  K-DUR  Take 10 mEq by mouth daily.     venlafaxine XR 150 MG 24 hr capsule  Commonly known as:  EFFEXOR-XR  Take 150 mg by mouth daily.        FOLLOW UP VISIT:       Follow-up Information    Follow up with Raymon Mutton, MD On 11/30/2014.   Specialty:  Orthopedic Surgery   Contact information:   [address] [place] [address] [phone]       DISPOSITION: HOME CONDITION:  Good   Tabrina Esty 12/14/2014, 1:23 PM

## 2015-06-13 DIAGNOSIS — N529 Male erectile dysfunction, unspecified: Secondary | ICD-10-CM

## 2015-06-13 HISTORY — DX: Male erectile dysfunction, unspecified: N52.9

## 2015-08-08 DIAGNOSIS — J209 Acute bronchitis, unspecified: Secondary | ICD-10-CM

## 2015-08-08 DIAGNOSIS — J440 Chronic obstructive pulmonary disease with acute lower respiratory infection: Secondary | ICD-10-CM

## 2015-08-08 HISTORY — DX: Acute bronchitis, unspecified: J44.0

## 2015-08-08 HISTORY — DX: Acute bronchitis, unspecified: J20.9

## 2015-09-07 DIAGNOSIS — E782 Mixed hyperlipidemia: Secondary | ICD-10-CM

## 2015-09-07 DIAGNOSIS — K257 Chronic gastric ulcer without hemorrhage or perforation: Secondary | ICD-10-CM

## 2015-09-07 DIAGNOSIS — E0590 Thyrotoxicosis, unspecified without thyrotoxic crisis or storm: Secondary | ICD-10-CM

## 2015-09-07 HISTORY — DX: Chronic gastric ulcer without hemorrhage or perforation: K25.7

## 2015-09-07 HISTORY — DX: Thyrotoxicosis, unspecified without thyrotoxic crisis or storm: E05.90

## 2015-09-07 HISTORY — DX: Mixed hyperlipidemia: E78.2

## 2015-11-20 DIAGNOSIS — R748 Abnormal levels of other serum enzymes: Secondary | ICD-10-CM | POA: Insufficient documentation

## 2015-11-20 DIAGNOSIS — F3340 Major depressive disorder, recurrent, in remission, unspecified: Secondary | ICD-10-CM

## 2015-11-20 DIAGNOSIS — M109 Gout, unspecified: Secondary | ICD-10-CM | POA: Insufficient documentation

## 2015-11-20 HISTORY — DX: Abnormal levels of other serum enzymes: R74.8

## 2015-11-20 HISTORY — DX: Major depressive disorder, recurrent, in remission, unspecified: F33.40

## 2015-11-21 DIAGNOSIS — Z0000 Encounter for general adult medical examination without abnormal findings: Secondary | ICD-10-CM

## 2015-11-21 HISTORY — DX: Encounter for general adult medical examination without abnormal findings: Z00.00

## 2015-12-12 DIAGNOSIS — N329 Bladder disorder, unspecified: Secondary | ICD-10-CM | POA: Insufficient documentation

## 2015-12-12 HISTORY — DX: Bladder disorder, unspecified: N32.9

## 2016-09-10 DIAGNOSIS — R079 Chest pain, unspecified: Secondary | ICD-10-CM | POA: Insufficient documentation

## 2016-09-10 HISTORY — DX: Chest pain, unspecified: R07.9

## 2017-05-31 IMAGING — CT CT ANKLE*L* W/O CM
2 of 5 series · 6 of 14 positions shown, 7 images · non-contrast
Comparison: None.

CLINICAL DATA: Left knee pain, preop knee replacement

EXAM:
CT OF THE LEFT HIP WITHOUT CONTRAST
CT OF THE LEFT KNEE WITHOUT CONTRAST
CT OF THE LEFT ANKLE WITHOUT CONTRAST
TECHNIQUE: Multidetector CT imaging of the left hip, knee and ankle was
performed according to the standard protocol. Multiplanar CT image
reconstructions were also generated.

[Series 5: knee soft (id) · axial · 0.34mm/px · z∈[-659,-496]mm · 3 of 131 slices shown]
[im 33/131  soft-tissue]
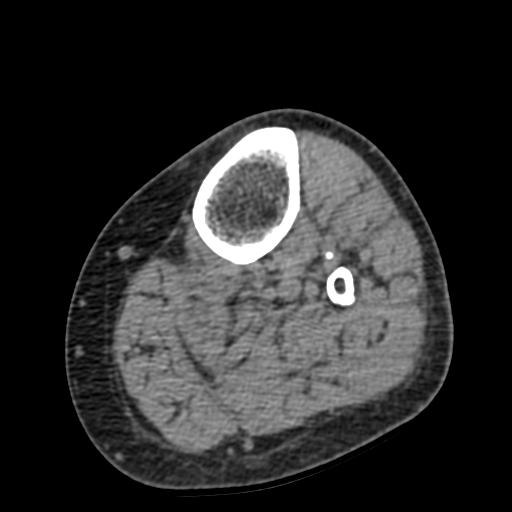
[im 66/131  soft-tissue]
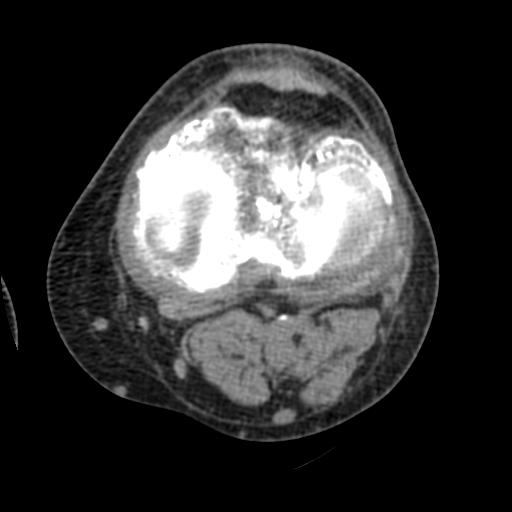
[im 98/131  soft-tissue]
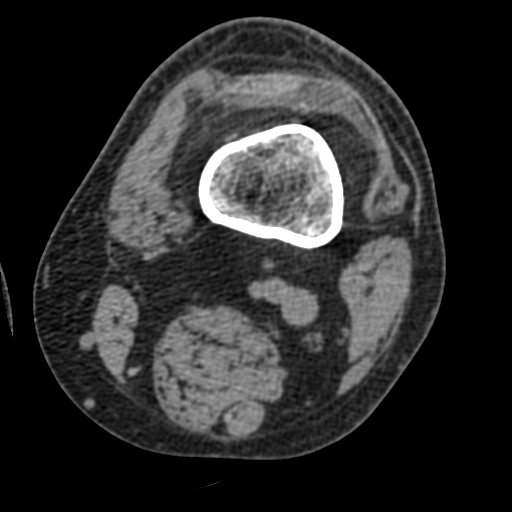

[Series 600: sag · axial · 0.34mm/px · z∈[-545,-379]mm · 3 of 75 slices shown, 4 images]
[im 1/75  soft-tissue]
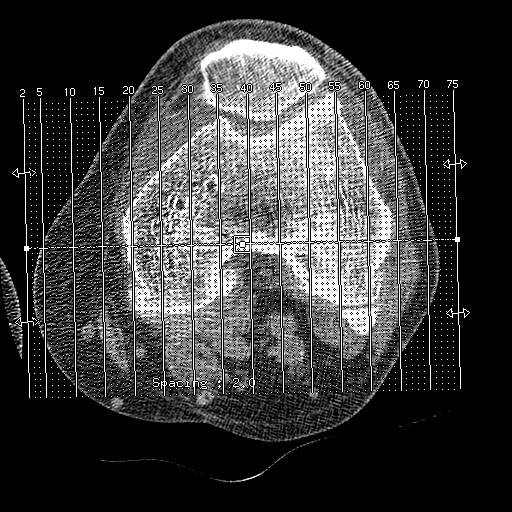
[im 1/75  bone]
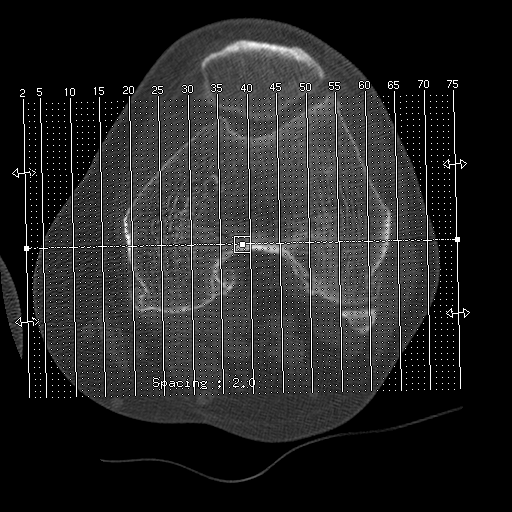
[im 38/75  bone]
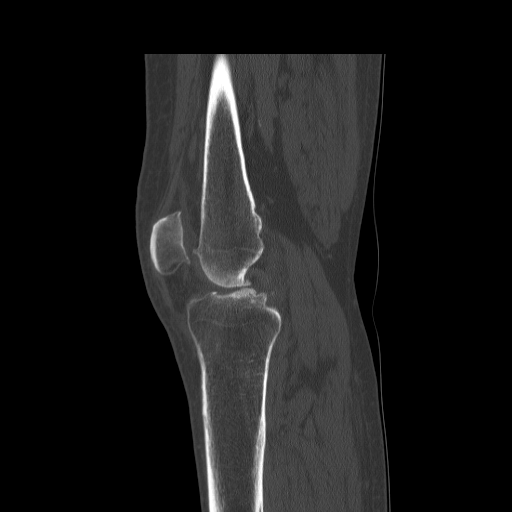
[im 75/75  bone]
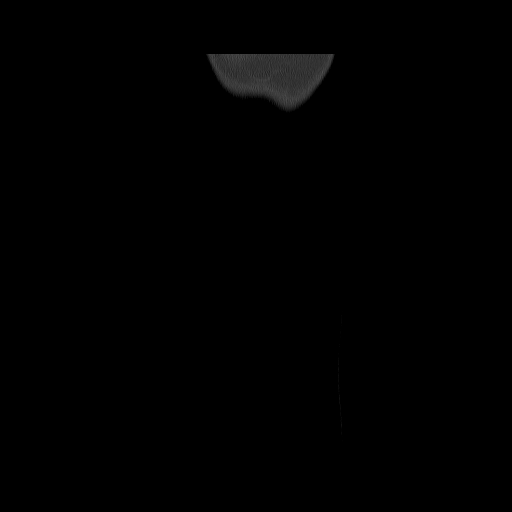

[6 of 14 positions shown; findings below may reference images not displayed]

FINDINGS: The hip demonstrates no fracture or dislocation. There is no lytic
or blastic lesion. There is peripheral vascular atherosclerotic
disease.

The knee demonstrates no fracture or dislocation. There is no lytic
or blastic lesion. There is severe tricompartmental osteoarthritis
of the left knee most severe in the medial femorotibial compartment
which joint space narrowing, subchondral sclerosis and marginal
osteophytosis. There is chondrocalcinosis of the lateral
femorotibial compartment as can be seen with CPPD. There is a small
joint effusion.

The ankle demonstrates no fracture or dislocation. There is no lytic
or blastic lesion.
IMPRESSION: 1. Tricompartmental osteoarthritis of the left knee, most severe in
the medial femorotibial compartment.

## 2017-07-24 DIAGNOSIS — L2389 Allergic contact dermatitis due to other agents: Secondary | ICD-10-CM

## 2017-07-24 HISTORY — DX: Allergic contact dermatitis due to other agents: L23.89

## 2017-07-29 DIAGNOSIS — R600 Localized edema: Secondary | ICD-10-CM | POA: Insufficient documentation

## 2017-07-29 HISTORY — DX: Localized edema: R60.0

## 2018-01-02 ENCOUNTER — Encounter: Payer: Self-pay | Admitting: Cardiology

## 2018-01-02 ENCOUNTER — Ambulatory Visit (INDEPENDENT_AMBULATORY_CARE_PROVIDER_SITE_OTHER): Payer: Medicare HMO | Admitting: Cardiology

## 2018-01-02 DIAGNOSIS — R0609 Other forms of dyspnea: Secondary | ICD-10-CM

## 2018-01-02 DIAGNOSIS — M7989 Other specified soft tissue disorders: Secondary | ICD-10-CM

## 2018-01-02 DIAGNOSIS — I10 Essential (primary) hypertension: Secondary | ICD-10-CM

## 2018-01-02 DIAGNOSIS — R0600 Dyspnea, unspecified: Secondary | ICD-10-CM

## 2018-01-02 DIAGNOSIS — I25119 Atherosclerotic heart disease of native coronary artery with unspecified angina pectoris: Secondary | ICD-10-CM | POA: Insufficient documentation

## 2018-01-02 DIAGNOSIS — Z0389 Encounter for observation for other suspected diseases and conditions ruled out: Secondary | ICD-10-CM

## 2018-01-02 DIAGNOSIS — E785 Hyperlipidemia, unspecified: Secondary | ICD-10-CM

## 2018-01-02 HISTORY — DX: Dyspnea, unspecified: R06.00

## 2018-01-02 HISTORY — DX: Hyperlipidemia, unspecified: E78.5

## 2018-01-02 HISTORY — DX: Other specified soft tissue disorders: M79.89

## 2018-01-02 HISTORY — DX: Other forms of dyspnea: R06.09

## 2018-01-02 NOTE — Progress Notes (Signed)
Cardiology Office Note:    Date:  01/02/2018   ID:  Richard Choi, DOB Mar 02, 1946, MRN [id number]  PCP:  Olive Bassough, Wessie Shanks L, MD  Cardiologist:  Gypsy Balsamobert Sabria Florido, MD    Referring MD: Olive Bassough, Nasiya Pascual L, MD   Chief Complaint  Patient presents with  . Follow-up  . Shortness of Breath  . Leg Swelling  I am short of breath and have swollen legs  History of Present Illness:    Richard Choi is a 72 y.o. male with coronary artery disease had a pleasure of taking care of him for many years but [place] I have not seen him for the time within last few weeks he started experiencing swelling of lower extremities as well as shortness of breath.  He did see Dr. Beverely Pacecheek who told him that the problem list medication that he is been taking however he was not satisfied with this and came to see me.  Denies have any chest pain that would be typical just sharp stabbing lasting only for split seconds not related to exercise.  Past Medical History:  Diagnosis Date  . Arthritis   . Coronary artery disease   . Depression   . Gout   . Hyperlipemia   . Hypertension   . Pancreatitis    approx 7-8 years ago  . Shortness of breath dyspnea    with ambulation  . Stomach ulcer     Past Surgical History:  Procedure Laterality Date  . CARDIAC CATHETERIZATION     01/03/10 ([facility]): NL LM, LCX, Ramus. 50% mLAD. 30% pRCA. 95% mRCA s/p BMS.  . CERVICAL FUSION    . CHOLECYSTECTOMY    . COLONOSCOPY    . KIDNEY STONE SURGERY    . KNEE ARTHROSCOPY Left    X 3   . TONSILLECTOMY    . TOTAL KNEE ARTHROPLASTY Left 11/15/2014   Procedure: TOTAL KNEE ARTHROPLASTY;  Surgeon: Dannielle HuhSteve Lucey, MD;  Location: [facility];  Service: Orthopedics;  Laterality: Left;    Current Medications: Current Meds  Medication Sig  . allopurinol (ZYLOPRIM) 100 MG tablet Take 100 mg by mouth 2 (two) times daily.  Marland Kitchen. amLODipine (NORVASC) 10 MG tablet Take 10 mg by mouth daily.  Marland Kitchen. atorvastatin (LIPITOR) 40 MG  tablet Take 40 mg by mouth daily.  Marland Kitchen. BYSTOLIC 20 MG TABS Take 1 tablet by mouth daily.  Marland Kitchen. doxazosin (CARDURA) 8 MG tablet Take 8 mg by mouth daily.   . furosemide (LASIX) 20 MG tablet Take 1 tablet by mouth daily.  . minoxidil (LONITEN) 10 MG tablet Take 2 tablets by mouth 2 (two) times daily.  . mometasone (ELOCON) 0.1 % cream APPLY EXTERNALLY TO RASH DAILY  . nitroGLYCERIN (NITROSTAT) 0.4 MG SL tablet Place 1 tablet under the tongue as needed for chest pain.  . ranitidine (ZANTAC) 150 MG tablet Take 150 mg by mouth 2 (two) times daily.  . tadalafil (CIALIS) 5 MG tablet Take 1 tablet by mouth daily.  Marland Kitchen. venlafaxine XR (EFFEXOR-XR) 150 MG 24 hr capsule Take 150 mg by mouth daily.     Allergies:   Benazepril hcl; Cefadroxil; Ciprofloxacin hcl; Clonidine derivatives; and Septra [sulfamethoxazole-trimethoprim]   Social History   Socioeconomic History  . Marital status: Married    Spouse name: Not on file  . Number of children: Not on file  . Years of education: Not on file  . Highest education level: Not on file  Occupational History  .  Not on file  Social Needs  . Financial resource strain: Not on file  . Food insecurity:    Worry: Not on file    Inability: Not on file  . Transportation needs:    Medical: Not on file    Non-medical: Not on file  Tobacco Use  . Smoking status: Current Every Day Smoker    Packs/day: 0.50    Years: 45.00    Pack years: 22.50  . Smokeless tobacco: Never Used  Substance and Sexual Activity  . Alcohol use: No  . Drug use: No  . Sexual activity: Not on file  Lifestyle  . Physical activity:    Days per week: Not on file    Minutes per session: Not on file  . Stress: Not on file  Relationships  . Social connections:    Talks on phone: Not on file    Gets together: Not on file    Attends religious service: Not on file    Active member of club or organization: Not on file    Attends meetings of clubs or organizations: Not on file     Relationship status: Not on file  Other Topics Concern  . Not on file  Social History Narrative  . Not on file     Family History: The patient's family history includes Alzheimer's disease in his mother. ROS:   Please see the history of present illness.    All 14 point review of systems negative except as described per history of present illness  EKGs/Labs/Other Studies Reviewed:      Recent Labs: No results found for requested labs within last 8760 hours.  Recent Lipid Panel No results found for: CHOL, TRIG, HDL, CHOLHDL, VLDL, LDLCALC, LDLDIRECT  Physical Exam:    VS:  BP (!) 110/56   Pulse (!) 56   Ht 6' (1.829 m)   Wt 186 lb 9.6 oz (84.6 kg)   SpO2 93%   BMI 25.31 kg/m     Wt Readings from Last 3 Encounters:  01/02/18 186 lb 9.6 oz (84.6 kg)  11/11/14 223 lb 11.2 oz (101.5 kg)  10/28/14 225 lb 1.6 oz (102.1 kg)     GEN:  Well nourished, well developed in no acute distress HEENT: Normal NECK: No JVD; No carotid bruits LYMPHATICS: No lymphadenopathy CARDIAC: RRR, no murmurs, no rubs, no gallops RESPIRATORY:  Clear to auscultation without rales, wheezing or rhonchi  ABDOMEN: Soft, non-tender, non-distended MUSCULOSKELETAL:  No edema; No deformity  SKIN: Warm and dry LOWER EXTREMITIES:swelling 1= NEUROLOGIC:  Alert and oriented x 3 PSYCHIATRIC:  Normal affect   ASSESSMENT:    1. Coronary artery disease (CAD) excluded   2. Essential hypertension   3. Dyslipidemia   4. Dyspnea on exertion   5. Swelling of both lower extremities    PLAN:    In order of problems listed above:  1. Coronary artery disease doing well from that point review asymptomatic and appropriate medications. 2. Essential hypertension blood pressure well controlled. 3. Dyslipidemia on statin which I will continue. 4. Dyspnea on exertion as well as swelling of lower extremities small dose of diuretic has been initiated 20 mg furosemide.  I will check Chem-7 as well as proBNP.  I will  schedule him to have echocardiogram to rule out congestive heart failure.   Medication Adjustments/Labs and Tests Ordered: Current medicines are reviewed at length with the patient today.  Concerns regarding medicines are outlined above.  No orders of the defined types were  placed in this encounter.  Medication changes: No orders of the defined types were placed in this encounter.   Signed, Georgeanna Lea, MD, [facility] 01/02/2018 4:14 PM    [facility]

## 2018-01-02 NOTE — Patient Instructions (Addendum)
Medication Instructions:  Your physician recommends that you continue on your current medications as directed. Please refer to the Current Medication list given to you today.  Labwork: None  Testing/Procedures: Your physician has requested that you have an echocardiogram. Echocardiography is a painless test that uses sound waves to create images of your heart. It provides your doctor with information about the size and shape of your heart and how well your heart's chambers and valves are working. This procedure takes approximately one hour. There are no restrictions for this procedure.  Follow-Up: Your physician recommends that you schedule a follow-up appointment in: 1 month.  Any Other Special Instructions Will Be Listed Below (If Applicable).     If you need a refill on your cardiac medications before your next appointment, please call your pharmacy.  Echocardiogram An echocardiogram, or echocardiography, uses sound waves (ultrasound) to produce an image of your heart. The echocardiogram is simple, painless, obtained within a short period of time, and offers valuable information to your health care provider. The images from an echocardiogram can provide information such as:  Evidence of coronary artery disease (CAD).  Heart size.  Heart muscle function.  Heart valve function.  Aneurysm detection.  Evidence of a past heart attack.  Fluid buildup around the heart.  Heart muscle thickening.  Assess heart valve function.  Tell a health care provider about:  Any allergies you have.  All medicines you are taking, including vitamins, herbs, eye drops, creams, and over-the-counter medicines.  Any problems you or family members have had with anesthetic medicines.  Any blood disorders you have.  Any surgeries you have had.  Any medical conditions you have.  Whether you are pregnant or may be pregnant. What happens before the procedure? No special preparation is  needed. Eat and drink normally. What happens during the procedure?  In order to produce an image of your heart, gel will be applied to your chest and a wand-like tool (transducer) will be moved over your chest. The gel will help transmit the sound waves from the transducer. The sound waves will harmlessly bounce off your heart to allow the heart images to be captured in real-time motion. These images will then be recorded.  You may need an IV to receive a medicine that improves the quality of the pictures. What happens after the procedure? You may return to your normal schedule including diet, activities, and medicines, unless your health care provider tells you otherwise. This information is not intended to replace advice given to you by your health care provider. Make sure you discuss any questions you have with your health care provider. Document Released: 04/06/2000 Document Revised: 11/26/2015 Document Reviewed: 12/15/2012 Elsevier Interactive Patient Education  2017 Elsevier Inc.   

## 2018-01-10 ENCOUNTER — Ambulatory Visit (HOSPITAL_BASED_OUTPATIENT_CLINIC_OR_DEPARTMENT_OTHER)
Admission: RE | Admit: 2018-01-10 | Discharge: 2018-01-10 | Disposition: A | Discharge status: Home / Self Care | Payer: Medicare HMO | Source: Ambulatory Visit | Attending: Cardiology | Admitting: Cardiology

## 2018-01-10 DIAGNOSIS — I083 Combined rheumatic disorders of mitral, aortic and tricuspid valves: Secondary | ICD-10-CM | POA: Diagnosis not present

## 2018-01-10 DIAGNOSIS — I2510 Atherosclerotic heart disease of native coronary artery without angina pectoris: Secondary | ICD-10-CM | POA: Diagnosis not present

## 2018-01-10 DIAGNOSIS — E785 Hyperlipidemia, unspecified: Secondary | ICD-10-CM | POA: Diagnosis not present

## 2018-01-10 DIAGNOSIS — Z0389 Encounter for observation for other suspected diseases and conditions ruled out: Secondary | ICD-10-CM | POA: Insufficient documentation

## 2018-01-10 DIAGNOSIS — R0609 Other forms of dyspnea: Secondary | ICD-10-CM | POA: Diagnosis present

## 2018-01-10 DIAGNOSIS — I119 Hypertensive heart disease without heart failure: Secondary | ICD-10-CM | POA: Insufficient documentation

## 2018-01-10 NOTE — Progress Notes (Signed)
  Echocardiogram 2D Echocardiogram has been performed.  Richard Choi T Ares Cardozo 01/10/2018, 12:08 PM

## 2018-01-29 ENCOUNTER — Other Ambulatory Visit: Payer: Self-pay

## 2018-01-29 MED ORDER — AMLODIPINE BESYLATE 10 MG PO TABS: 10.0000 mg | ORAL_TABLET | Freq: Every day | ORAL | 1 refills | Status: DC

## 2018-02-10 ENCOUNTER — Ambulatory Visit: Payer: Medicare HMO | Admitting: Cardiology

## 2018-02-10 DIAGNOSIS — B070 Plantar wart: Secondary | ICD-10-CM

## 2018-02-10 HISTORY — DX: Plantar wart: B07.0

## 2018-03-07 ENCOUNTER — Ambulatory Visit: Payer: Medicare HMO | Admitting: Cardiology

## 2018-04-29 ENCOUNTER — Encounter: Payer: Self-pay | Admitting: Cardiology

## 2018-04-29 ENCOUNTER — Ambulatory Visit: Payer: Medicare HMO | Admitting: Cardiology

## 2018-04-29 VITALS — BP 130/60 | HR 51 | Ht 72.0 in | Wt 201.2 lb

## 2018-04-29 DIAGNOSIS — I10 Essential (primary) hypertension: Secondary | ICD-10-CM | POA: Diagnosis not present

## 2018-04-29 DIAGNOSIS — I25119 Atherosclerotic heart disease of native coronary artery with unspecified angina pectoris: Secondary | ICD-10-CM

## 2018-04-29 DIAGNOSIS — E785 Hyperlipidemia, unspecified: Secondary | ICD-10-CM | POA: Diagnosis not present

## 2018-04-29 DIAGNOSIS — R0609 Other forms of dyspnea: Secondary | ICD-10-CM | POA: Diagnosis not present

## 2018-04-29 DIAGNOSIS — M7989 Other specified soft tissue disorders: Secondary | ICD-10-CM

## 2018-04-29 NOTE — Patient Instructions (Signed)
Medication Instructions:   Your physician recommends that you continue on your current medications as directed. Please refer to the Current Medication list given to you today.   If you need a refill on your cardiac medications before your next appointment, please call your pharmacy.   Lab work:  Your physician recommends that you return for lab work today: BMET, BNP  If you have labs (blood work) drawn today and your tests are completely normal, you will receive your results only by: Marland Kitchen MyChart Message (if you have MyChart) OR . A paper copy in the mail If you have any lab test that is abnormal or we need to change your treatment, we will call you to review the results.  Testing/Procedures: Your physician has requested that you have a lexiscan myoview. For further information please visit [email or web address] Please follow instruction sheet, as given.  Your physician has recommended that you have a pulmonary function test. Pulmonary Function Tests are a group of tests that measure how well air moves in and out of your lungs.    Follow-Up: At [facility], you and your health needs are our priority.  As part of our continuing mission to provide you with exceptional heart care, we have created designated Provider Care Teams.  These Care Teams include your primary Cardiologist (physician) and Advanced Practice Providers (APPs -  Physician Assistants and Nurse Practitioners) who all work together to provide you with the care you need, when you need it. You will need a follow up appointment in 1 months.   You may see Gypsy Balsam, MD or another member of our [facility] Provider Team in [place]: Norman Herrlich, MD . Belva Crome, MD   Any Other Special Instructions Will Be Listed Below (If Applicable).  [facility] will contact you to schedule PFT testing.

## 2018-04-29 NOTE — Progress Notes (Signed)
Cardiology Office Note:    Date:  04/29/2018   ID:  Richard Choi, DOB [birth year], MRN [id number]  PCP:  Olive Bass, MD  Cardiologist:  Gypsy Balsam, MD    Referring MD: Olive Bass, MD   Chief Complaint  Patient presents with  . Follow up on Echo  I have shortness of breath and swelling of lower extremities  History of Present Illness:    Richard Choi is a 73 y.o. male with very complex past medical history.  He does have coronary artery disease last estimation was done in May 2018 with cardiac catheterization.  He got multiple lesions with the worst involving was proximal LAD 50% also proximal RCA 50%.  He comes to me because a few months ago he ran out of medications after that he noticed swelling of lower extremities he was put back by his primary care physician to all his medications however swelling of lower extremities still present on top of that he described to have some shortness of breath.  Previously when he got coronary artery disease problem he was complaining of having pain in the left arm it does not happen anymore only very rare occasions.  Sadly he still continue to smoke however reduced significantly amount of cigarettes he smokes.  Swelling of lower extremities worsened evening time.  He does not have paroxysmal nocturnal dyspnea but exertional shortness of breath is there.  Past Medical History:  Diagnosis Date  . Arthritis   . Coronary artery disease   . Depression   . Gout   . Hyperlipemia   . Hypertension   . Pancreatitis    approx 7-8 years ago  . Shortness of breath dyspnea    with ambulation  . Stomach ulcer     Past Surgical History:  Procedure Laterality Date  . CARDIAC CATHETERIZATION     01/03/10 ([facility]): NL LM, LCX, Ramus. 50% mLAD. 30% pRCA. 95% mRCA s/p BMS.  . CERVICAL FUSION    . CHOLECYSTECTOMY    . COLONOSCOPY    . KIDNEY STONE SURGERY    . KNEE ARTHROSCOPY Left    X 3   . TONSILLECTOMY    . TOTAL KNEE ARTHROPLASTY  Left 11/15/2014   Procedure: TOTAL KNEE ARTHROPLASTY;  Surgeon: Dannielle Huh, MD;  Location: [facility] OR;  Service: Orthopedics;  Laterality: Left;    Current Medications: Current Meds  Medication Sig  . allopurinol (ZYLOPRIM) 100 MG tablet Take 100 mg by mouth 2 (two) times daily.  Marland Kitchen amLODipine (NORVASC) 10 MG tablet Take 1 tablet (10 mg total) by mouth daily.  Marland Kitchen aspirin EC 81 MG tablet Take 81 mg by mouth daily.  Marland Kitchen atorvastatin (LIPITOR) 40 MG tablet Take 40 mg by mouth daily.  Marland Kitchen BYSTOLIC 20 MG TABS Take 1 tablet by mouth 2 (two) times daily.   . clobetasol cream (TEMOVATE) 0.05 % APPLY TO AFFECTED AREAS ON ABDOMEN TWICE DAILY  . doxazosin (CARDURA) 8 MG tablet Take 8 mg by mouth daily.   . furosemide (LASIX) 20 MG tablet Take 1 tablet by mouth daily.  Marland Kitchen loratadine (CLARITIN) 10 MG tablet Take 1 tablet by mouth daily.  . minoxidil (LONITEN) 10 MG tablet Take 2 tablets by mouth 2 (two) times daily.  . nitroGLYCERIN (NITROSTAT) 0.4 MG SL tablet Place 1 tablet under the tongue as needed for chest pain.  . ranitidine (ZANTAC) 300 MG tablet Take 300 mg by mouth 2 (two) times daily.   . tadalafil (CIALIS) 5 MG  tablet Take 1 tablet by mouth daily as needed.   . venlafaxine XR (EFFEXOR-XR) 150 MG 24 hr capsule Take 150 mg by mouth daily.     Allergies:   Benazepril hcl; Cefadroxil; Ciprofloxacin hcl; Clonidine derivatives; and Septra [sulfamethoxazole-trimethoprim]   Social History   Socioeconomic History  . Marital status: Married    Spouse name: Not on file  . Number of children: Not on file  . Years of education: Not on file  . Highest education level: Not on file  Occupational History  . Not on file  Social Needs  . Financial resource strain: Not on file  . Food insecurity:    Worry: Not on file    Inability: Not on file  . Transportation needs:    Medical: Not on file    Non-medical: Not on file  Tobacco Use  . Smoking status: Current Every Day Smoker    Packs/day: 0.50    Years:  45.00    Pack years: 22.50  . Smokeless tobacco: Never Used  Substance and Sexual Activity  . Alcohol use: No  . Drug use: No  . Sexual activity: Not on file  Lifestyle  . Physical activity:    Days per week: Not on file    Minutes per session: Not on file  . Stress: Not on file  Relationships  . Social connections:    Talks on phone: Not on file    Gets together: Not on file    Attends religious service: Not on file    Active member of club or organization: Not on file    Attends meetings of clubs or organizations: Not on file    Relationship status: Not on file  Other Topics Concern  . Not on file  Social History Narrative  . Not on file     Family History: The patient's family history includes Alzheimer's disease in his mother. ROS:   Please see the history of present illness.    All 14 point review of systems negative except as described per history of present illness  EKGs/Labs/Other Studies Reviewed:      Recent Labs: No results found for requested labs within last 8760 hours.  Recent Lipid Panel No results found for: CHOL, TRIG, HDL, CHOLHDL, VLDL, LDLCALC, LDLDIRECT  Physical Exam:    VS:  BP 130/60   Pulse (!) 51   Ht 6' (1.829 m)   Wt 201 lb 3.2 oz (91.3 kg)   SpO2 97%   BMI 27.29 kg/m     Wt Readings from Last 3 Encounters:  04/29/18 201 lb 3.2 oz (91.3 kg)  01/02/18 186 lb 9.6 oz (84.6 kg)  11/11/14 223 lb 11.2 oz (101.5 kg)     GEN:  Well nourished, well developed in no acute distress HEENT: Normal NECK: No JVD; No carotid bruits LYMPHATICS: No lymphadenopathy CARDIAC: RRR, no murmurs, no rubs, no gallops RESPIRATORY:  Clear to auscultation without rales, wheezing or rhonchi  ABDOMEN: Soft, non-tender, non-distended MUSCULOSKELETAL:  No edema; No deformity  SKIN: Warm and dry LOWER EXTREMITIES: no swelling NEUROLOGIC:  Alert and oriented x 3 PSYCHIATRIC:  Normal affect   ASSESSMENT:    1. Coronary artery disease (CAD) excluded   2.  Essential hypertension   3. Dyspnea on exertion   4. Dyslipidemia   5. Swelling of both lower extremities    PLAN:    In order of problems listed above:  1. Coronary artery disease.  I will schedule him to have a  stress test make sure there is no inducible ischemia we will continue with all medications including aspirin.  He is already on high intensity statin which I will continue. 2. Essential hypertension blood pressure well controlled we will continue present management.  Unhappy about the fact that he is on amlodipine that contribute to swelling of lower extremities in the future will probably need to change this medication to a different medication to reduce his blood pressure. 3. Dyspnea on exertion multifactorial smoking clearly play significant role here.  On top of that he did have echocardiogram which showed pulmonary hypertension with pulmonary pressure 48 mmHg I will schedule him to have pulmonary function test to see what part of this problem is related to his lungs. 4. Dyslipidemia continue high intensity statin.  Later we will recheck it again he ran out of medications therefore I will wait a few weeks more before checking his fasting lipid profile 5. Swelling of lower extremities Chem-7 proBNP will be done today and then medication will be adjusted.  Overall the few issues raising concern in his situation probably the biggest problem appears to be now pulmonary hypertension which need to be aggressively investigated.   Medication Adjustments/Labs and Tests Ordered: Current medicines are reviewed at length with the patient today.  Concerns regarding medicines are outlined above.  No orders of the defined types were placed in this encounter.  Medication changes: No orders of the defined types were placed in this encounter.   Signed, Georgeanna Leaobert J. , MD, [facility] 04/29/2018 8:25 AM    [facility]

## 2018-04-30 LAB — BASIC METABOLIC PANEL
BUN/Creatinine Ratio: 7 — ABNORMAL LOW (ref 10–24)
BUN: 11 mg/dL (ref 8–27)
CO2: 22 mmol/L (ref 20–29)
CREATININE: 1.65 mg/dL — AB (ref 0.76–1.27)
Calcium: 8.9 mg/dL (ref 8.6–10.2)
Chloride: 105 mmol/L (ref 96–106)
GFR calc Af Amer: 47 mL/min/{1.73_m2} — ABNORMAL LOW (ref >59)
GFR calc non Af Amer: 41 mL/min/{1.73_m2} — ABNORMAL LOW (ref >59)
Glucose: 96 mg/dL (ref 65–99)
Potassium: 4.2 mmol/L (ref 3.5–5.2)
Sodium: 141 mmol/L (ref 134–144)

## 2018-04-30 LAB — BRAIN NATRIURETIC PEPTIDE: BNP: 695.8 pg/mL — ABNORMAL HIGH (ref 0.0–100.0)

## 2018-05-02 ENCOUNTER — Telehealth: Payer: Self-pay | Admitting: Cardiology

## 2018-05-02 DIAGNOSIS — I10 Essential (primary) hypertension: Secondary | ICD-10-CM

## 2018-05-02 DIAGNOSIS — M7989 Other specified soft tissue disorders: Secondary | ICD-10-CM

## 2018-05-02 MED ORDER — FUROSEMIDE 20 MG PO TABS: 40.0000 mg | ORAL_TABLET | Freq: Every day | ORAL | 1 refills | Status: DC

## 2018-05-02 NOTE — Telephone Encounter (Signed)
Patient returned your call for results °

## 2018-05-02 NOTE — Telephone Encounter (Signed)
Patient informed of lab results and informed per Dr. Bing Matter to in crease lasix to 40 mg daily and have labs redrawn in one week. Patient verbally understands

## 2018-05-09 ENCOUNTER — Telehealth: Payer: Self-pay | Admitting: Cardiology

## 2018-05-09 NOTE — Telephone Encounter (Signed)
Patient states that he was to be scheduled for PFT at [facility] and he has not heard about the test yet or when he goes, please call him.

## 2018-05-09 NOTE — Telephone Encounter (Signed)
Tried to contact patient with his appointment date and time. He did not answer and voicemail can not be left.   He is scheduled for 05/13/2018 with [facility] at [address], [place]. Arriving at 1045.   Directions are as follows: No caffeine or Alcohol 4 hours prior to testing, No smoking 1 hour prior, No vigorous exercise 30 minutes prior, wear lose clothing and No Nebs 3 hours prior.

## 2018-05-09 NOTE — Telephone Encounter (Signed)
Patient has called back and was given date and time along with his directions.

## 2018-05-10 LAB — BASIC METABOLIC PANEL
BUN/Creatinine Ratio: 8 — ABNORMAL LOW (ref 10–24)
BUN: 12 mg/dL (ref 8–27)
CALCIUM: 9.1 mg/dL (ref 8.6–10.2)
CO2: 25 mmol/L (ref 20–29)
Chloride: 102 mmol/L (ref 96–106)
Creatinine, Ser: 1.5 mg/dL — ABNORMAL HIGH (ref 0.76–1.27)
GFR calc Af Amer: 53 mL/min/{1.73_m2} — ABNORMAL LOW (ref >59)
GFR calc non Af Amer: 46 mL/min/{1.73_m2} — ABNORMAL LOW (ref >59)
Glucose: 87 mg/dL (ref 65–99)
POTASSIUM: 3.5 mmol/L (ref 3.5–5.2)
Sodium: 142 mmol/L (ref 134–144)

## 2018-05-10 LAB — PRO B NATRIURETIC PEPTIDE: NT-Pro BNP: 1792 pg/mL — ABNORMAL HIGH (ref 0–376)

## 2018-05-12 ENCOUNTER — Telehealth: Payer: Self-pay | Admitting: Emergency Medicine

## 2018-05-12 DIAGNOSIS — R0609 Other forms of dyspnea: Secondary | ICD-10-CM

## 2018-05-12 DIAGNOSIS — I10 Essential (primary) hypertension: Secondary | ICD-10-CM

## 2018-05-12 NOTE — Telephone Encounter (Signed)
Patient informed of lab results. Advised patient to continue lasix 20 mg twice daily, restrict sodium, and to weigh daily. Patient verbally understands and will have labs drawn in 1 week.

## 2018-05-13 ENCOUNTER — Ambulatory Visit (INDEPENDENT_AMBULATORY_CARE_PROVIDER_SITE_OTHER): Payer: Medicare HMO | Admitting: Internal Medicine

## 2018-05-13 DIAGNOSIS — R0609 Other forms of dyspnea: Secondary | ICD-10-CM | POA: Diagnosis not present

## 2018-05-13 LAB — PULMONARY FUNCTION TEST
DL/VA % pred: 76 %
DL/VA: 3.55 ml/min/mmHg/L
DLCO unc % pred: 48 %
DLCO unc: 16 ml/min/mmHg
FEF 25-75 POST: 2.47 L/s
FEF 25-75 Pre: 1.69 L/sec
FEF2575-%Change-Post: 46 %
FEF2575-%Pred-Post: 101 %
FEF2575-%Pred-Pre: 69 %
FEV1-%CHANGE-POST: 6 %
FEV1-%Pred-Post: 83 %
FEV1-%Pred-Pre: 78 %
FEV1-Post: 2.72 L
FEV1-Pre: 2.56 L
FEV1FVC-%Change-Post: 5 %
FEV1FVC-%Pred-Pre: 100 %
FEV6-%Change-Post: 1 %
FEV6-%Pred-Post: 83 %
FEV6-%Pred-Pre: 82 %
FEV6-Post: 3.48 L
FEV6-Pre: 3.44 L
FEV6FVC-%Change-Post: 0 %
FEV6FVC-%Pred-Post: 104 %
FEV6FVC-%Pred-Pre: 104 %
FVC-%Change-Post: 0 %
FVC-%PRED-PRE: 78 %
FVC-%Pred-Post: 79 %
FVC-Post: 3.52 L
FVC-Pre: 3.49 L
PRE FEV6/FVC RATIO: 99 %
Post FEV1/FVC ratio: 77 %
Post FEV6/FVC ratio: 99 %
Pre FEV1/FVC ratio: 73 %
RV % pred: 89 %
RV: 2.24 L
TLC % pred: 81 %
TLC: 5.79 L

## 2018-05-13 NOTE — Progress Notes (Signed)
PFT done today. 

## 2018-05-19 ENCOUNTER — Telehealth (HOSPITAL_COMMUNITY): Payer: Self-pay | Admitting: *Deleted

## 2018-05-19 NOTE — Telephone Encounter (Signed)
Patient given detailed instructions per Myocardial Perfusion Study Information Sheet for the test on  02/19/19. Patient notified to arrive 15 minutes early and that it is imperative to arrive on time for appointment to keep from having the test rescheduled.  If you need to cancel or reschedule your appointment, please call the office within 24 hours of your appointment. . Patient verbalized understanding. Ricky Ala

## 2018-05-21 ENCOUNTER — Ambulatory Visit (INDEPENDENT_AMBULATORY_CARE_PROVIDER_SITE_OTHER): Payer: Medicare HMO

## 2018-05-21 DIAGNOSIS — I25119 Atherosclerotic heart disease of native coronary artery with unspecified angina pectoris: Secondary | ICD-10-CM | POA: Diagnosis not present

## 2018-05-21 LAB — MYOCARDIAL PERFUSION IMAGING
LV dias vol: 190 mL (ref 62–150)
LV sys vol: 69 mL
Peak HR: 64 {beats}/min
Rest HR: 49 {beats}/min
SDS: 0
SRS: 0
SSS: 0
TID: 1.07

## 2018-05-21 MED ORDER — REGADENOSON 0.4 MG/5ML IV SOLN
0.4000 mg | Freq: Once | INTRAVENOUS | Status: AC
  Administered 2018-05-21: 0.4 mg via INTRAVENOUS

## 2018-05-21 MED ORDER — TECHNETIUM TC 99M TETROFOSMIN IV KIT
31.6000 | PACK | Freq: Once | INTRAVENOUS | Status: AC | PRN
  Administered 2018-05-21: 31.6 via INTRAVENOUS

## 2018-05-21 MED ORDER — TECHNETIUM TC 99M TETROFOSMIN IV KIT
10.9000 | PACK | Freq: Once | INTRAVENOUS | Status: AC | PRN
  Administered 2018-05-21: 10.9 via INTRAVENOUS

## 2018-05-30 ENCOUNTER — Ambulatory Visit: Payer: Medicare HMO | Admitting: Cardiology

## 2018-06-10 ENCOUNTER — Telehealth: Payer: Self-pay | Admitting: Emergency Medicine

## 2018-06-10 NOTE — Telephone Encounter (Signed)
Reminded patient he is overdue for lab work he verbally understands and will have labs drawn today

## 2018-06-11 LAB — BASIC METABOLIC PANEL
BUN/Creatinine Ratio: 8 — ABNORMAL LOW (ref 10–24)
BUN: 12 mg/dL (ref 8–27)
CO2: 24 mmol/L (ref 20–29)
Calcium: 9 mg/dL (ref 8.6–10.2)
Chloride: 102 mmol/L (ref 96–106)
Creatinine, Ser: 1.52 mg/dL — ABNORMAL HIGH (ref 0.76–1.27)
GFR calc Af Amer: 52 mL/min/{1.73_m2} — ABNORMAL LOW (ref >59)
GFR calc non Af Amer: 45 mL/min/{1.73_m2} — ABNORMAL LOW (ref >59)
Glucose: 121 mg/dL — ABNORMAL HIGH (ref 65–99)
Potassium: 3.8 mmol/L (ref 3.5–5.2)
Sodium: 140 mmol/L (ref 134–144)

## 2018-06-11 LAB — PRO B NATRIURETIC PEPTIDE: NT-Pro BNP: 2369 pg/mL — ABNORMAL HIGH (ref 0–376)

## 2018-06-13 ENCOUNTER — Telehealth: Payer: Self-pay | Admitting: Emergency Medicine

## 2018-06-13 DIAGNOSIS — I10 Essential (primary) hypertension: Secondary | ICD-10-CM

## 2018-06-13 MED ORDER — FUROSEMIDE 20 MG PO TABS: 60.0000 mg | ORAL_TABLET | Freq: Every day | ORAL | 1 refills | Status: DC

## 2018-06-13 NOTE — Telephone Encounter (Signed)
Patient reports he is taking lasix 40 mg daily. Per Dr. Bing Matter patient is to increase lasix to 60 mg daily and have bmp checked in 1 week. Patient verbally understands

## 2018-06-17 ENCOUNTER — Other Ambulatory Visit: Payer: Self-pay | Admitting: Cardiology

## 2018-06-21 LAB — BASIC METABOLIC PANEL
BUN/Creatinine Ratio: 9 — ABNORMAL LOW (ref 10–24)
BUN: 14 mg/dL (ref 8–27)
CO2: 25 mmol/L (ref 20–29)
Calcium: 8.8 mg/dL (ref 8.6–10.2)
Chloride: 103 mmol/L (ref 96–106)
Creatinine, Ser: 1.53 mg/dL — ABNORMAL HIGH (ref 0.76–1.27)
GFR calc Af Amer: 52 mL/min/{1.73_m2} — ABNORMAL LOW (ref >59)
GFR calc non Af Amer: 45 mL/min/{1.73_m2} — ABNORMAL LOW (ref >59)
Glucose: 96 mg/dL (ref 65–99)
Potassium: 3.5 mmol/L (ref 3.5–5.2)
SODIUM: 141 mmol/L (ref 134–144)

## 2018-06-24 ENCOUNTER — Telehealth: Payer: Self-pay | Admitting: *Deleted

## 2018-06-24 NOTE — Telephone Encounter (Signed)
Informed pt of blood work per Dr. Bing Matter, all normal, no changes in medication. Pt thanked me for results.

## 2018-06-27 ENCOUNTER — Ambulatory Visit: Payer: Medicare HMO | Admitting: Cardiology

## 2018-07-15 DIAGNOSIS — N183 Chronic kidney disease, stage 3 unspecified: Secondary | ICD-10-CM | POA: Insufficient documentation

## 2018-07-15 HISTORY — DX: Chronic kidney disease, stage 3 unspecified: N18.30

## 2018-07-18 ENCOUNTER — Other Ambulatory Visit: Payer: Self-pay | Admitting: Cardiology

## 2018-09-12 ENCOUNTER — Other Ambulatory Visit: Payer: Self-pay | Admitting: Cardiology

## 2018-10-21 ENCOUNTER — Telehealth: Payer: Self-pay | Admitting: Cardiology

## 2018-10-21 MED ORDER — ATORVASTATIN CALCIUM 40 MG PO TABS: 40.0000 mg | ORAL_TABLET | Freq: Every day | ORAL | 0 refills | Status: DC

## 2018-10-21 NOTE — Addendum Note (Signed)
Addended by: Ashok Norris on: 10/21/2018 11:55 AM   Modules accepted: Orders

## 2018-10-21 NOTE — Telephone Encounter (Signed)
Refilled atorvastatin 40 mg daily 

## 2018-10-21 NOTE — Telephone Encounter (Signed)
Call atorvastatin to [facility] on [address]

## 2018-10-28 DIAGNOSIS — Z8673 Personal history of transient ischemic attack (TIA), and cerebral infarction without residual deficits: Secondary | ICD-10-CM

## 2018-10-28 HISTORY — DX: Personal history of transient ischemic attack (TIA), and cerebral infarction without residual deficits: Z86.73

## 2018-11-14 ENCOUNTER — Ambulatory Visit (INDEPENDENT_AMBULATORY_CARE_PROVIDER_SITE_OTHER): Payer: Medicare HMO | Admitting: Cardiology

## 2018-11-14 ENCOUNTER — Other Ambulatory Visit: Payer: Self-pay

## 2018-11-14 ENCOUNTER — Encounter: Payer: Self-pay | Admitting: Cardiology

## 2018-11-14 VITALS — BP 120/60 | HR 56 | Ht 72.0 in | Wt 195.0 lb

## 2018-11-14 DIAGNOSIS — F17200 Nicotine dependence, unspecified, uncomplicated: Secondary | ICD-10-CM

## 2018-11-14 DIAGNOSIS — IMO0001 Reserved for inherently not codable concepts without codable children: Secondary | ICD-10-CM

## 2018-11-14 DIAGNOSIS — I25119 Atherosclerotic heart disease of native coronary artery with unspecified angina pectoris: Secondary | ICD-10-CM

## 2018-11-14 DIAGNOSIS — I10 Essential (primary) hypertension: Secondary | ICD-10-CM

## 2018-11-14 DIAGNOSIS — E785 Hyperlipidemia, unspecified: Secondary | ICD-10-CM

## 2018-11-14 DIAGNOSIS — R0609 Other forms of dyspnea: Secondary | ICD-10-CM

## 2018-11-14 HISTORY — DX: Reserved for inherently not codable concepts without codable children: IMO0001

## 2018-11-14 HISTORY — DX: Nicotine dependence, unspecified, uncomplicated: F17.200

## 2018-11-14 NOTE — Patient Instructions (Signed)
Medication Instructions:  Your physician recommends that you continue on your current medications as directed. Please refer to the Current Medication list given to you today.  If you need a refill on your cardiac medications before your next appointment, please call your pharmacy.   Lab work: None ordered   If you have labs (blood work) drawn today and your tests are completely normal, you will receive your results only by: . MyChart Message (if you have MyChart) OR . A paper copy in the mail If you have any lab test that is abnormal or we need to change your treatment, we will call you to review the results.  Testing/Procedures: None ordered  Follow-Up: At [facility], you and your health needs are our priority.  As part of our continuing mission to provide you with exceptional heart care, we have created designated Provider Care Teams.  These Care Teams include your primary Cardiologist (physician) and Advanced Practice Providers (APPs -  Physician Assistants and Nurse Practitioners) who all work together to provide you with the care you need, when you need it. You will need a follow up appointment in 4 months.  You may see Robert Krasowski, MD or another member of our [facility] Provider Team in [place]: Brian Munley, MD . Rajan Revankar, MD    

## 2018-11-14 NOTE — Progress Notes (Signed)
Cardiology Office Note:    Date:  11/14/2018   ID:  Richard SorensonRalph L Madrid, DOB 1946/03/20, MRN [id number]  PCP:  Olive Bassough, Laynie Espy L, MD  Cardiologist:  Gypsy Balsamobert Danah Reinecke, MD    Referring MD: Olive Bassough, Deshauna Cayson L, MD   Chief Complaint  Patient presents with  . 1 month follow up  Doing well cardiac wise  History of Present Illness:    Richard Choi is a 73 y.o. male with coronary artery disease, last cardiac catheterization done in 2018 and they showed 50% LAD, 10% of circumflex, 50% RCA lesion.  Chronic smoking, depression, hypertension, dyslipidemia.  Comes today to my office for follow-up cardiac wise doing well he described to have sharp stabbing-like sensation in the chest not related to exercise lasting only for a few seconds.  Described also to have some shortness of breath.  Recently he ended up going to the beach he enjoyed himself however had difficulty moving around because of shortness of breath.  He decided to cut down on smoking and I congratulated him for it.  Past Medical History:  Diagnosis Date  . Arthritis   . Coronary artery disease   . Depression   . Gout   . Hyperlipemia   . Hypertension   . Pancreatitis    approx 7-8 years ago  . Shortness of breath dyspnea    with ambulation  . Stomach ulcer     Past Surgical History:  Procedure Laterality Date  . CARDIAC CATHETERIZATION     01/03/10 ([facility]): NL LM, LCX, Ramus. 50% mLAD. 30% pRCA. 95% mRCA s/p BMS.  . CERVICAL FUSION    . CHOLECYSTECTOMY    . COLONOSCOPY    . KIDNEY STONE SURGERY    . KNEE ARTHROSCOPY Left    X 3   . TONSILLECTOMY    . TOTAL KNEE ARTHROPLASTY Left 11/15/2014   Procedure: TOTAL KNEE ARTHROPLASTY;  Surgeon: Dannielle HuhSteve Lucey, MD;  Location: [facility];  Service: Orthopedics;  Laterality: Left;    Current Medications: Current Meds  Medication Sig  . allopurinol (ZYLOPRIM) 100 MG tablet Take 100 mg by mouth 2 (two) times daily.  Marland Kitchen. amLODipine (NORVASC) 10 MG tablet TAKE 1 TABLET (10 MG TOTAL) BY MOUTH  DAILY.  Marland Kitchen. aspirin EC 81 MG tablet Take 81 mg by mouth daily.  Marland Kitchen. atorvastatin (LIPITOR) 40 MG tablet Take 1 tablet (40 mg total) by mouth daily.  Marland Kitchen. BYSTOLIC 20 MG TABS TAKE 1 TABLET BY MOUTH EVERY MORNING FOR HIGH BLOOD PRESSURE  . clobetasol cream (TEMOVATE) 0.05 % APPLY TO AFFECTED AREAS ON ABDOMEN TWICE DAILY  . doxazosin (CARDURA) 8 MG tablet Take 8 mg by mouth daily.   . furosemide (LASIX) 20 MG tablet TAKE 2 TABLETS(40 MG) BY MOUTH DAILY  . loratadine (CLARITIN) 10 MG tablet Take 1 tablet by mouth daily.  . minoxidil (LONITEN) 10 MG tablet Take 2 tablets by mouth 2 (two) times daily.  . nitroGLYCERIN (NITROSTAT) 0.4 MG SL tablet Place 1 tablet under the tongue as needed for chest pain.  . ranitidine (ZANTAC) 300 MG tablet Take 300 mg by mouth 2 (two) times daily.   . tadalafil (CIALIS) 5 MG tablet Take 1 tablet by mouth daily as needed.   . venlafaxine XR (EFFEXOR-XR) 150 MG 24 hr capsule Take 150 mg by mouth daily.     Allergies:   Benazepril hcl, Cefadroxil, Ciprofloxacin hcl, Clonidine derivatives, and Septra [sulfamethoxazole-trimethoprim]   Social History   Socioeconomic History  . Marital status: Married  Spouse name: Not on file  . Number of children: Not on file  . Years of education: Not on file  . Highest education level: Not on file  Occupational History  . Not on file  Social Needs  . Financial resource strain: Not on file  . Food insecurity    Worry: Not on file    Inability: Not on file  . Transportation needs    Medical: Not on file    Non-medical: Not on file  Tobacco Use  . Smoking status: Current Every Day Smoker    Packs/day: 0.50    Years: 45.00    Pack years: 22.50  . Smokeless tobacco: Never Used  Substance and Sexual Activity  . Alcohol use: No  . Drug use: No  . Sexual activity: Not on file  Lifestyle  . Physical activity    Days per week: Not on file    Minutes per session: Not on file  . Stress: Not on file  Relationships  . Social  Herbalist on phone: Not on file    Gets together: Not on file    Attends religious service: Not on file    Active member of club or organization: Not on file    Attends meetings of clubs or organizations: Not on file    Relationship status: Not on file  Other Topics Concern  . Not on file  Social History Narrative  . Not on file     Family History: The patient's family history includes Alzheimer's disease in his mother. ROS:   Please see the history of present illness.    All 14 point review of systems negative except as described per history of present illness  EKGs/Labs/Other Studies Reviewed:      Recent Labs: 04/29/2018: BNP 695.8 06/10/2018: NT-Pro BNP 2,369 06/20/2018: BUN 14; Creatinine, Ser 1.53; Potassium 3.5; Sodium 141  Recent Lipid Panel No results found for: CHOL, TRIG, HDL, CHOLHDL, VLDL, LDLCALC, LDLDIRECT  Physical Exam:    VS:  BP 120/60   Pulse (!) 56   Ht 6' (1.829 m)   Wt 195 lb (88.5 kg)   SpO2 99%   BMI 26.45 kg/m     Wt Readings from Last 3 Encounters:  11/14/18 195 lb (88.5 kg)  05/21/18 201 lb (91.2 kg)  04/29/18 201 lb 3.2 oz (91.3 kg)     GEN:  Well nourished, well developed in no acute distress HEENT: Normal NECK: No JVD; No carotid bruits LYMPHATICS: No lymphadenopathy CARDIAC: RRR, no murmurs, no rubs, no gallops RESPIRATORY:  Clear to auscultation without rales, wheezing or rhonchi  ABDOMEN: Soft, non-tender, non-distended MUSCULOSKELETAL:  No edema; No deformity  SKIN: Warm and dry LOWER EXTREMITIES: no swelling NEUROLOGIC:  Alert and oriented x 3 PSYCHIATRIC:  Normal affect   ASSESSMENT:    1. Coronary artery disease involving native coronary artery of native heart with angina pectoris ([place])   2. Essential hypertension   3. Dyspnea on exertion   4. Dyslipidemia   5. Smoking    PLAN:    In order of problems listed above:  1. Coronary artery disease doing well from that point review last stress test reviewed  with the patient negative. 2. Essential hypertension blood pressure well controlled continue present management. 3. Dyspnea on exertion most likely caused by the fact that he smokes he cut down significantly with intention to quit. 4. Dyslipidemia he is on Lipitor 40 which I will continue. 5. Smoking long discussion again  and I strongly tried to convince him to quit.   Medication Adjustments/Labs and Tests Ordered: Current medicines are reviewed at length with the patient today.  Concerns regarding medicines are outlined above.  No orders of the defined types were placed in this encounter.  Medication changes: No orders of the defined types were placed in this encounter.   Signed, Georgeanna Leaobert J. Namari Breton, MD, [facility] Center[facility] 11/14/2018 2:56 PM    [facility]

## 2018-11-24 DIAGNOSIS — N3944 Nocturnal enuresis: Secondary | ICD-10-CM | POA: Insufficient documentation

## 2018-11-24 HISTORY — DX: Nocturnal enuresis: N39.44

## 2018-12-03 ENCOUNTER — Other Ambulatory Visit: Payer: Self-pay | Admitting: Cardiology

## 2018-12-22 DIAGNOSIS — R5383 Other fatigue: Secondary | ICD-10-CM | POA: Insufficient documentation

## 2018-12-22 HISTORY — DX: Other fatigue: R53.83

## 2019-01-27 ENCOUNTER — Other Ambulatory Visit: Payer: Self-pay | Admitting: *Deleted

## 2019-02-04 ENCOUNTER — Other Ambulatory Visit: Payer: Self-pay

## 2019-02-04 MED ORDER — FUROSEMIDE 20 MG PO TABS: 60.0000 mg | ORAL_TABLET | Freq: Every day | ORAL | 0 refills | Status: DC

## 2019-02-13 ENCOUNTER — Ambulatory Visit (INDEPENDENT_AMBULATORY_CARE_PROVIDER_SITE_OTHER): Payer: Medicare HMO | Admitting: Cardiology

## 2019-02-13 ENCOUNTER — Other Ambulatory Visit: Payer: Self-pay

## 2019-02-13 ENCOUNTER — Encounter: Payer: Self-pay | Admitting: Cardiology

## 2019-02-13 VITALS — BP 112/60 | HR 50 | Ht 72.0 in | Wt 181.2 lb

## 2019-02-13 DIAGNOSIS — E785 Hyperlipidemia, unspecified: Secondary | ICD-10-CM

## 2019-02-13 DIAGNOSIS — F17200 Nicotine dependence, unspecified, uncomplicated: Secondary | ICD-10-CM

## 2019-02-13 DIAGNOSIS — R0609 Other forms of dyspnea: Secondary | ICD-10-CM

## 2019-02-13 DIAGNOSIS — I25119 Atherosclerotic heart disease of native coronary artery with unspecified angina pectoris: Secondary | ICD-10-CM

## 2019-02-13 DIAGNOSIS — R0600 Dyspnea, unspecified: Secondary | ICD-10-CM

## 2019-02-13 DIAGNOSIS — I10 Essential (primary) hypertension: Secondary | ICD-10-CM

## 2019-02-13 MED ORDER — BYSTOLIC 20 MG PO TABS: 10.0000 mg | ORAL_TABLET | Freq: Every day | ORAL | 1 refills | Status: DC

## 2019-02-13 NOTE — Addendum Note (Signed)
Addended by: Linna Hoff R on: 02/13/2019 11:50 AM   Modules accepted: Orders

## 2019-02-13 NOTE — Patient Instructions (Signed)
Medication Instructions:  Your physician has recommended you make the following change in your medication:  Decrease Bystolic to 10 mg daily  *If you need a refill on your cardiac medications before your next appointment, please call your pharmacy*  Lab Work: Your physician recommends that you return for lab work today: bmp bnp   If you have labs (blood work) drawn today and your tests are completely normal, you will receive your results only by: Marland Kitchen MyChart Message (if you have MyChart) OR . A paper copy in the mail If you have any lab test that is abnormal or we need to change your treatment, we will call you to review the results.  Testing/Procedures: Your physician has requested that you have an echocardiogram. Echocardiography is a painless test that uses sound waves to create images of your heart. It provides your doctor with information about the size and shape of your heart and how well your heart's chambers and valves are working. This procedure takes approximately one hour. There are no restrictions for this procedure.    Follow-Up: At [facility], you and your health needs are our priority.  As part of our continuing mission to provide you with exceptional heart care, we have created designated Provider Care Teams.  These Care Teams include your primary Cardiologist (physician) and Advanced Practice Providers (APPs -  Physician Assistants and Nurse Practitioners) who all work together to provide you with the care you need, when you need it.  Your next appointment:   2 month   The format for your next appointment:   In Person  Provider:   Jenne Campus, MD  Other Instructions   Echocardiogram An echocardiogram is a procedure that uses painless sound waves (ultrasound) to produce an image of the heart. Images from an echocardiogram can provide important information about:  Signs of coronary artery disease (CAD).  Aneurysm detection. An aneurysm is a weak or damaged  part of an artery wall that bulges out from the normal force of blood pumping through the body.  Heart size and shape. Changes in the size or shape of the heart can be associated with certain conditions, including heart failure, aneurysm, and CAD.  Heart muscle function.  Heart valve function.  Signs of a past heart attack.  Fluid buildup around the heart.  Thickening of the heart muscle.  A tumor or infectious growth around the heart valves. Tell a health care provider about:  Any allergies you have.  All medicines you are taking, including vitamins, herbs, eye drops, creams, and over-the-counter medicines.  Any blood disorders you have.  Any surgeries you have had.  Any medical conditions you have.  Whether you are pregnant or may be pregnant. What are the risks? Generally, this is a safe procedure. However, problems may occur, including:  Allergic reaction to dye (contrast) that may be used during the procedure. What happens before the procedure? No specific preparation is needed. You may eat and drink normally. What happens during the procedure?   An IV tube may be inserted into one of your veins.  You may receive contrast through this tube. A contrast is an injection that improves the quality of the pictures from your heart.  A gel will be applied to your chest.  A wand-like tool (transducer) will be moved over your chest. The gel will help to transmit the sound waves from the transducer.  The sound waves will harmlessly bounce off of your heart to allow the heart images to be  captured in real-time motion. The images will be recorded on a computer. The procedure may vary among health care providers and hospitals. What happens after the procedure?  You may return to your normal, everyday life, including diet, activities, and medicines, unless your health care provider tells you not to do that. Summary  An echocardiogram is a procedure that uses painless sound  waves (ultrasound) to produce an image of the heart.  Images from an echocardiogram can provide important information about the size and shape of your heart, heart muscle function, heart valve function, and fluid buildup around your heart.  You do not need to do anything to prepare before this procedure. You may eat and drink normally.  After the echocardiogram is completed, you may return to your normal, everyday life, unless your health care provider tells you not to do that. This information is not intended to replace advice given to you by your health care provider. Make sure you discuss any questions you have with your health care provider. Document Released: 04/06/2000 Document Revised: 07/31/2018 Document Reviewed: 05/12/2016 Elsevier Patient Education  2020 [employer].

## 2019-02-13 NOTE — Progress Notes (Signed)
Cardiology Office Note:    Date:  02/13/2019   ID:  Richard Choi, DOB [birth year], MRN [id number]  PCP:  Algis Greenhouse, MD  Cardiologist:  Jenne Campus, MD    Referring MD: Algis Greenhouse, MD   Chief Complaint  Patient presents with  . Follow up per Dr. Alcide Clever    History of Present Illness:    Richard Choi is a 73 y.o. male with coronary artery disease, COPD, essential hypertension, dyslipidemia comes today to my office for follow-up he complained of having worsening shortness of breath.  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  Denies have any chest pain, tightness, pressure, burning in the chest.  Past Medical History:  Diagnosis Date  . Arthritis   . Coronary artery disease   . Depression   . Gout   . Hyperlipemia   . Hypertension   . Pancreatitis    approx 7-8 years ago  . Shortness of breath dyspnea    with ambulation  . Stomach ulcer     Past Surgical History:  Procedure Laterality Date  . CARDIAC CATHETERIZATION     01/03/10 ([facility]): NL LM, LCX, Ramus. 50% mLAD. 30% pRCA. 95% mRCA s/p BMS.  . CERVICAL FUSION    . CHOLECYSTECTOMY    . COLONOSCOPY    . KIDNEY STONE SURGERY    . KNEE ARTHROSCOPY Left    X 3   . TONSILLECTOMY    . TOTAL KNEE ARTHROPLASTY Left 11/15/2014   Procedure: TOTAL KNEE ARTHROPLASTY;  Surgeon: Vickey Huger, MD;  Location: [place];  Service: Orthopedics;  Laterality: Left;    Current Medications: Current Meds  Medication Sig  . albuterol (VENTOLIN HFA) 108 (90 Base) MCG/ACT inhaler Inhale 2 puffs into the lungs every 6 (six) hours as needed.  Marland Kitchen allopurinol (ZYLOPRIM) 100 MG tablet Take 100 mg by mouth 2 (two) times daily.  Marland Kitchen amLODipine (NORVASC) 10 MG tablet TAKE 1 TABLET EVERY DAY  . aspirin EC 81 MG tablet  Take 81 mg by mouth daily.  Marland Kitchen atorvastatin (LIPITOR) 40 MG tablet Take 1 tablet (40 mg total) by mouth daily.  Marland Kitchen BYSTOLIC 20 MG TABS TAKE 1 TABLET BY MOUTH EVERY MORNING FOR HIGH BLOOD PRESSURE  . clobetasol cream (TEMOVATE) 0.05 % APPLY TO AFFECTED AREAS ON ABDOMEN TWICE DAILY  . doxazosin (CARDURA) 8 MG tablet Take 8 mg by mouth daily.   . famotidine (PEPCID) 40 MG tablet Take 1 tablet by mouth daily.  . furosemide (LASIX) 20 MG tablet Take 3 tablets (60 mg total) by mouth daily.  Marland Kitchen loratadine (CLARITIN) 10 MG tablet Take 1 tablet by mouth daily.  . minoxidil (LONITEN) 10 MG tablet Take 2 tablets by mouth 2 (two) times daily.  . nitroGLYCERIN (NITROSTAT) 0.4 MG SL tablet Place 1 tablet under the tongue as needed for chest pain.  . tadalafil (CIALIS) 5 MG tablet Take 1 tablet by mouth daily as needed.   . TRELEGY ELLIPTA 100-62.5-25 MCG/INH AEPB Inhale 2 puffs into the lungs daily.  Marland Kitchen venlafaxine XR (EFFEXOR-XR) 150 MG 24 hr capsule Take 150 mg by mouth daily.     Allergies:   Benazepril hcl, Cefadroxil, Ciprofloxacin hcl, Clonidine derivatives, and Septra [sulfamethoxazole-trimethoprim]   Social History   Socioeconomic History  . Marital status: Married  Spouse name: Not on file  . Number of children: Not on file  . Years of education: Not on file  . Highest education level: Not on file  Occupational History  . Not on file  Social Needs  . Financial resource strain: Not on file  . Food insecurity    Worry: Not on file    Inability: Not on file  . Transportation needs    Medical: Not on file    Non-medical: Not on file  Tobacco Use  . Smoking status: Current Every Day Smoker    Packs/day: 0.50    Years: 45.00    Pack years: 22.50  . Smokeless tobacco: Never Used  Substance and Sexual Activity  . Alcohol use: No  . Drug use: No  . Sexual activity: Not on file  Lifestyle  . Physical activity    Days per week: Not on file    Minutes per session: Not on file  . Stress:  Not on file  Relationships  . Social Musician on phone: Not on file    Gets together: Not on file    Attends religious service: Not on file    Active member of club or organization: Not on file    Attends meetings of clubs or organizations: Not on file    Relationship status: Not on file  Other Topics Concern  . Not on file  Social History Narrative  . Not on file     Family History: The patient's family history includes Alzheimer's disease in his mother. ROS:   Please see the history of present illness.    All 14 point review of systems negative except as described per history of present illness  EKGs/Labs/Other Studies Reviewed:      Recent Labs: 04/29/2018: BNP 695.8 06/10/2018: NT-Pro BNP 2,369 06/20/2018: BUN 14; Creatinine, Ser 1.53; Potassium 3.5; Sodium 141  Recent Lipid Panel No results found for: CHOL, TRIG, HDL, CHOLHDL, VLDL, LDLCALC, LDLDIRECT  Physical Exam:    VS:  BP 112/60   Pulse (!) 50   Ht 6' (1.829 m)   Wt 181 lb 3.2 oz (82.2 kg)   SpO2 98%   BMI 24.58 kg/m     Wt Readings from Last 3 Encounters:  02/13/19 181 lb 3.2 oz (82.2 kg)  11/14/18 195 lb (88.5 kg)  05/21/18 201 lb (91.2 kg)     GEN:  Well nourished, well developed in no acute distress HEENT: Normal NECK: No JVD; No carotid bruits LYMPHATICS: No lymphadenopathy CARDIAC: RRR, no murmurs, no rubs, no gallops RESPIRATORY:  Clear to auscultation without rales, wheezing or rhonchi  ABDOMEN: Soft, non-tender, non-distended MUSCULOSKELETAL:  No edema; No deformity  SKIN: Warm and dry LOWER EXTREMITIES: no swelling NEUROLOGIC:  Alert and oriented x 3 PSYCHIATRIC:  Normal affect   ASSESSMENT:    1. Essential hypertension   2. Coronary artery disease involving native coronary artery of native heart with angina pectoris ([facility])   3. Smoking   4. Dyslipidemia    PLAN:    In order of problems listed above:  1. Essential hypertension blood pressure well controlled continue  present management 2. Coronary artery disease last stress test negative he does not have any chest pain, tightness, pressure, burning in the chest 3. Smoking he smokes 3 to 4 cigarettes a day I congratulated him for it and told him that the ultimate goal will be completely discontinue smoking 4. Dyslipidemia on statin which I will continue 5. Bradycardia EKG  done today shows sinus bradycardia rate of 51.  Normal QS complex duration morphology no ST segment changes we will cut down his Bystolic to only 10 mg daily 6. Worsening of shortness of breath.  I will schedule him to have an echocardiogram today we will do proBNP as well as Chem-7.   Medication Adjustments/Labs and Tests Ordered: Current medicines are reviewed at length with the patient today.  Concerns regarding medicines are outlined above.  No orders of the defined types were placed in this encounter.  Medication changes: No orders of the defined types were placed in this encounter.   Signed, Georgeanna Leaobert J. Oswaldo Cueto, MD, [facility] Center[facility]ACC 02/13/2019 11:41 AM    [facility]

## 2019-02-13 NOTE — Addendum Note (Signed)
Addended by: Beckey Rutter on: 02/13/2019 04:06 PM   Modules accepted: Orders

## 2019-02-14 LAB — BASIC METABOLIC PANEL
BUN/Creatinine Ratio: 6 — ABNORMAL LOW (ref 10–24)
BUN: 7 mg/dL — ABNORMAL LOW (ref 8–27)
CO2: 25 mmol/L (ref 20–29)
Calcium: 9 mg/dL (ref 8.6–10.2)
Chloride: 101 mmol/L (ref 96–106)
Creatinine, Ser: 1.17 mg/dL (ref 0.76–1.27)
GFR calc Af Amer: 72 mL/min/{1.73_m2} (ref >59)
GFR calc non Af Amer: 62 mL/min/{1.73_m2} (ref >59)
Glucose: 90 mg/dL (ref 65–99)
Potassium: 4.1 mmol/L (ref 3.5–5.2)
Sodium: 139 mmol/L (ref 134–144)

## 2019-02-14 LAB — PRO B NATRIURETIC PEPTIDE: NT-Pro BNP: 2144 pg/mL — ABNORMAL HIGH (ref 0–376)

## 2019-02-16 ENCOUNTER — Telehealth: Payer: Self-pay | Admitting: Emergency Medicine

## 2019-02-16 ENCOUNTER — Other Ambulatory Visit: Payer: Self-pay | Admitting: Cardiology

## 2019-02-16 DIAGNOSIS — I10 Essential (primary) hypertension: Secondary | ICD-10-CM

## 2019-02-16 DIAGNOSIS — R0609 Other forms of dyspnea: Secondary | ICD-10-CM

## 2019-02-16 MED ORDER — FUROSEMIDE 20 MG PO TABS: 40.0000 mg | ORAL_TABLET | Freq: Every day | ORAL | 1 refills | Status: DC

## 2019-02-16 MED ORDER — POTASSIUM CHLORIDE ER 10 MEQ PO TBCR: 10.0000 meq | EXTENDED_RELEASE_TABLET | Freq: Every day | ORAL | 0 refills | Status: DC

## 2019-02-16 MED ORDER — FUROSEMIDE 20 MG PO TABS: ORAL_TABLET | ORAL | 1 refills | Status: DC

## 2019-02-16 NOTE — Telephone Encounter (Signed)
Called patient informed him per Dr. Agustin Cree for patient to increase lasix to 60mg  in the morning and 40 mg in the evening, we also added potassium 10 meq daily and advised him to have labs redrawn in 1 week. Patient verbally understood. No further questions.

## 2019-02-16 NOTE — Telephone Encounter (Signed)
Patient came in office and reports he isn't taking lasix 60 mg daily as reported. Dr. Agustin Cree aware and advised the patient only take lasix 40 mg daily for now along with potassium 10 meq daily. Barnetta Chapel, RN informed patient of recommendations.

## 2019-03-17 ENCOUNTER — Ambulatory Visit: Payer: Medicare HMO | Admitting: Cardiology

## 2019-03-21 ENCOUNTER — Other Ambulatory Visit: Payer: Self-pay | Admitting: Cardiology

## 2019-03-24 ENCOUNTER — Other Ambulatory Visit: Payer: Self-pay

## 2019-03-24 ENCOUNTER — Ambulatory Visit (INDEPENDENT_AMBULATORY_CARE_PROVIDER_SITE_OTHER): Payer: Medicare HMO

## 2019-03-24 DIAGNOSIS — I25119 Atherosclerotic heart disease of native coronary artery with unspecified angina pectoris: Secondary | ICD-10-CM | POA: Diagnosis not present

## 2019-03-24 NOTE — Progress Notes (Signed)
Complete echocardiogram has been performed.  Jimmy Delmo Matty RDCS, RVT 

## 2019-03-29 ENCOUNTER — Other Ambulatory Visit: Payer: Self-pay | Admitting: Cardiology

## 2019-04-13 ENCOUNTER — Ambulatory Visit (INDEPENDENT_AMBULATORY_CARE_PROVIDER_SITE_OTHER): Payer: Medicare HMO | Admitting: Cardiology

## 2019-04-13 ENCOUNTER — Encounter: Payer: Self-pay | Admitting: Cardiology

## 2019-04-13 ENCOUNTER — Other Ambulatory Visit: Payer: Self-pay

## 2019-04-13 VITALS — BP 144/60 | HR 56 | Ht 72.0 in | Wt 192.8 lb

## 2019-04-13 DIAGNOSIS — R0600 Dyspnea, unspecified: Secondary | ICD-10-CM

## 2019-04-13 DIAGNOSIS — I10 Essential (primary) hypertension: Secondary | ICD-10-CM | POA: Diagnosis not present

## 2019-04-13 DIAGNOSIS — F17200 Nicotine dependence, unspecified, uncomplicated: Secondary | ICD-10-CM | POA: Diagnosis not present

## 2019-04-13 DIAGNOSIS — E785 Hyperlipidemia, unspecified: Secondary | ICD-10-CM | POA: Diagnosis not present

## 2019-04-13 DIAGNOSIS — I25119 Atherosclerotic heart disease of native coronary artery with unspecified angina pectoris: Secondary | ICD-10-CM

## 2019-04-13 DIAGNOSIS — R0609 Other forms of dyspnea: Secondary | ICD-10-CM

## 2019-04-13 NOTE — Addendum Note (Signed)
Addended by: Linna Hoff R on: 04/13/2019 02:00 PM   Modules accepted: Orders

## 2019-04-13 NOTE — Patient Instructions (Signed)
Medication Instructions:  Your physician recommends that you continue on your current medications as directed. Please refer to the Current Medication list given to you today.  *If you need a refill on your cardiac medications before your next appointment, please call your pharmacy*  Lab Work: Your physician recommends that you return for lab work today: bmp, pro bnp   If you have labs (blood work) drawn today and your tests are completely normal, you will receive your results only by: Marland Kitchen MyChart Message (if you have MyChart) OR . A paper copy in the mail If you have any lab test that is abnormal or we need to change your treatment, we will call you to review the results.  Testing/Procedures: None.   Follow-Up: At [facility], you and your health needs are our priority.  As part of our continuing mission to provide you with exceptional heart care, we have created designated Provider Care Teams.  These Care Teams include your primary Cardiologist (physician) and Advanced Practice Providers (APPs -  Physician Assistants and Nurse Practitioners) who all work together to provide you with the care you need, when you need it.  Your next appointment:   4 month(s)  The format for your next appointment:   In Person  Provider:   Jenne Campus, MD  Other Instructions

## 2019-04-13 NOTE — Progress Notes (Signed)
Cardiology Office Note:    Date:  04/13/2019   ID:  Richard SorensonRalph L Moccia, DOB 1945/10/29, MRN [id number]  PCP:  Olive Bassough, Surina Storts L, MD  Cardiologist:  Gypsy Balsamobert Latessa Tillis, MD    Referring MD: Olive Bassough, Ximena Todaro L, MD   Chief Complaint  Patient presents with  . Follow-up  Doing well, shortness of breath improved  History of Present Illness:    Richard Choi is a 73 y.o. male with past medical history significant for remote, artery disease, essential hypertension, smoking, episode of exertion, dyslipidemia.  Comes today 2 months to follow-up last time I seen him he was complaining of having shortness of breath but that seems to be improving he did see pulmonary doctor who gave him some spray that seems to be helping I think we dealing with combination of 2 problems diastolic congestive heart failure as well as COPD.  Last proBNP was elevated with increased dose of diuretic that seems to be helping.  Past Medical History:  Diagnosis Date  . Arthritis   . Coronary artery disease   . Depression   . Gout   . Hyperlipemia   . Hypertension   . Pancreatitis    approx 7-8 years ago  . Shortness of breath dyspnea    with ambulation  . Stomach ulcer     Past Surgical History:  Procedure Laterality Date  . CARDIAC CATHETERIZATION     01/03/10 ([facility]): NL LM, LCX, Ramus. 50% mLAD. 30% pRCA. 95% mRCA s/p BMS.  . CERVICAL FUSION    . CHOLECYSTECTOMY    . COLONOSCOPY    . KIDNEY STONE SURGERY    . KNEE ARTHROSCOPY Left    X 3   . TONSILLECTOMY    . TOTAL KNEE ARTHROPLASTY Left 11/15/2014   Procedure: TOTAL KNEE ARTHROPLASTY;  Surgeon: Dannielle HuhSteve Lucey, MD;  Location: [facility] OR;  Service: Orthopedics;  Laterality: Left;    Current Medications: Current Meds  Medication Sig  . albuterol (VENTOLIN HFA) 108 (90 Base) MCG/ACT inhaler Inhale 2 puffs into the lungs every 6 (six) hours as needed.  Marland Kitchen. allopurinol (ZYLOPRIM) 100 MG tablet Take 100 mg by mouth 2 (two) times daily.  Marland Kitchen. amLODipine (NORVASC) 10 MG  tablet TAKE 1 TABLET EVERY DAY  . aspirin EC 81 MG tablet Take 81 mg by mouth daily.  Marland Kitchen. atorvastatin (LIPITOR) 40 MG tablet Take 1 tablet (40 mg total) by mouth daily.  . clobetasol cream (TEMOVATE) 0.05 % APPLY TO AFFECTED AREAS ON ABDOMEN TWICE DAILY  . doxazosin (CARDURA) 8 MG tablet Take 8 mg by mouth daily.   . furosemide (LASIX) 20 MG tablet TAKE 3 TABLETS BY MOUTH EVERY MORNING AND 2 TABLETS BY MOUTH EVERY EVENING  . furosemide (LASIX) 40 MG tablet Take 1 tablet (40 mg total) by mouth daily.  Marland Kitchen. loratadine (CLARITIN) 10 MG tablet Take 1 tablet by mouth daily.  . minoxidil (LONITEN) 10 MG tablet Take 2 tablets by mouth 2 (two) times daily.  . Nebivolol HCl (BYSTOLIC) 20 MG TABS Take 0.5 tablets (10 mg total) by mouth daily.  . nitroGLYCERIN (NITROSTAT) 0.4 MG SL tablet Place 1 tablet under the tongue as needed for chest pain.  . potassium chloride (KLOR-CON) 10 MEQ tablet Take 1 tablet (10 mEq total) by mouth daily.  . tadalafil (CIALIS) 5 MG tablet Take 1 tablet by mouth daily as needed.   . TRELEGY ELLIPTA 100-62.5-25 MCG/INH AEPB Inhale 2 puffs into the lungs daily.  Marland Kitchen. venlafaxine XR (EFFEXOR-XR) 150 MG 24 hr  capsule Take 150 mg by mouth daily.     Allergies:   Benazepril hcl, Cefadroxil, Ciprofloxacin hcl, Clonidine derivatives, and Septra [sulfamethoxazole-trimethoprim]   Social History   Socioeconomic History  . Marital status: Married    Spouse name: Not on file  . Number of children: Not on file  . Years of education: Not on file  . Highest education level: Not on file  Occupational History  . Not on file  Tobacco Use  . Smoking status: Current Every Day Smoker    Packs/day: 0.50    Years: 45.00    Pack years: 22.50  . Smokeless tobacco: Never Used  Substance and Sexual Activity  . Alcohol use: No  . Drug use: No  . Sexual activity: Not on file  Other Topics Concern  . Not on file  Social History Narrative  . Not on file   Social Determinants of Health    Financial Resource Strain:   . Difficulty of Paying Living Expenses: Not on file  Food Insecurity:   . Worried About Charity fundraiser in the Last Year: Not on file  . Ran Out of Food in the Last Year: Not on file  Transportation Needs:   . Lack of Transportation (Medical): Not on file  . Lack of Transportation (Non-Medical): Not on file  Physical Activity:   . Days of Exercise per Week: Not on file  . Minutes of Exercise per Session: Not on file  Stress:   . Feeling of Stress : Not on file  Social Connections:   . Frequency of Communication with Friends and Family: Not on file  . Frequency of Social Gatherings with Friends and Family: Not on file  . Attends Religious Services: Not on file  . Active Member of Clubs or Organizations: Not on file  . Attends Archivist Meetings: Not on file  . Marital Status: Not on file     Family History: The patient's family history includes Alzheimer's disease in his mother. ROS:   Please see the history of present illness.    All 14 point review of systems negative except as described per history of present illness  EKGs/Labs/Other Studies Reviewed:      Recent Labs: 04/29/2018: BNP 695.8 02/13/2019: BUN 7; Creatinine, Ser 1.17; NT-Pro BNP 2,144; Potassium 4.1; Sodium 139  Recent Lipid Panel No results found for: CHOL, TRIG, HDL, CHOLHDL, VLDL, LDLCALC, LDLDIRECT  Physical Exam:    VS:  BP (!) 144/60   Pulse (!) 56   Ht 6' (1.829 m)   Wt 192 lb 12.8 oz (87.5 kg)   SpO2 98%   BMI 26.15 kg/m     Wt Readings from Last 3 Encounters:  04/13/19 192 lb 12.8 oz (87.5 kg)  02/13/19 181 lb 3.2 oz (82.2 kg)  11/14/18 195 lb (88.5 kg)     GEN:  Well nourished, well developed in no acute distress HEENT: Normal NECK: No JVD; No carotid bruits LYMPHATICS: No lymphadenopathy CARDIAC: RRR, no murmurs, no rubs, no gallops RESPIRATORY:  Clear to auscultation without rales, wheezing or rhonchi  ABDOMEN: Soft, non-tender,  non-distended MUSCULOSKELETAL:  No edema; No deformity  SKIN: Warm and dry LOWER EXTREMITIES: no swelling NEUROLOGIC:  Alert and oriented x 3 PSYCHIATRIC:  Normal affect   ASSESSMENT:    1. Coronary artery disease involving native coronary artery of native heart with angina pectoris ([place])   2. Essential hypertension   3. Smoking   4. Dyslipidemia   5. Dyspnea on  exertion    PLAN:    In order of problems listed above:  1. Coronary disease doing well from that point review denies having a problem. 2. Essential hypertension blood pressure still on the higher side.  I will ask him to have Chem-7 done as well as proBNP and based on that we will decide about therapy. 3. Smoking 3 to 5 cigarettes a day I told him that because completely discontinue he understand that. 4. Dyslipidemia he is taking Lipitor 40 which I will continue. 5. Sinus bradycardia not critical last time I cut down his Bystolic seems to be helping but in the future we will probably end up putting monitor on.   Medication Adjustments/Labs and Tests Ordered: Current medicines are reviewed at length with the patient today.  Concerns regarding medicines are outlined above.  No orders of the defined types were placed in this encounter.  Medication changes: No orders of the defined types were placed in this encounter.   Signed, Georgeanna Lea, MD, [facility] 04/13/2019 1:53 PM    [facility]

## 2019-04-14 LAB — BASIC METABOLIC PANEL
BUN/Creatinine Ratio: 10 (ref 10–24)
BUN: 13 mg/dL (ref 8–27)
CO2: 26 mmol/L (ref 20–29)
Calcium: 9.3 mg/dL (ref 8.6–10.2)
Chloride: 97 mmol/L (ref 96–106)
Creatinine, Ser: 1.32 mg/dL — ABNORMAL HIGH (ref 0.76–1.27)
GFR calc Af Amer: 61 mL/min/{1.73_m2} (ref >59)
GFR calc non Af Amer: 53 mL/min/{1.73_m2} — ABNORMAL LOW (ref >59)
Glucose: 95 mg/dL (ref 65–99)
Potassium: 4.1 mmol/L (ref 3.5–5.2)
Sodium: 138 mmol/L (ref 134–144)

## 2019-04-14 LAB — PRO B NATRIURETIC PEPTIDE: NT-Pro BNP: 835 pg/mL — ABNORMAL HIGH (ref 0–376)

## 2019-04-15 ENCOUNTER — Telehealth: Payer: Self-pay | Admitting: Emergency Medicine

## 2019-04-15 ENCOUNTER — Other Ambulatory Visit: Payer: Self-pay | Admitting: Cardiology

## 2019-04-15 MED ORDER — HYDRALAZINE HCL 10 MG PO TABS: 10.0000 mg | ORAL_TABLET | Freq: Three times a day (TID) | ORAL | 1 refills | Status: DC

## 2019-04-15 NOTE — Telephone Encounter (Signed)
Attempted to call patient for results. No answer. Voicemail box full will continue efforts.

## 2019-04-15 NOTE — Telephone Encounter (Signed)
Patient called back and informed of results and advised to start hydralazine 10 mg three times daily per Dr. Agustin Cree. Patient verbally understands. No further questions.

## 2019-04-15 NOTE — Addendum Note (Signed)
Addended by: Ashok Norris on: 04/15/2019 10:04 AM   Modules accepted: Orders

## 2019-04-18 ENCOUNTER — Other Ambulatory Visit: Payer: Self-pay | Admitting: Cardiology

## 2019-04-29 ENCOUNTER — Other Ambulatory Visit: Payer: Self-pay | Admitting: Cardiology

## 2019-05-05 DIAGNOSIS — L989 Disorder of the skin and subcutaneous tissue, unspecified: Secondary | ICD-10-CM | POA: Insufficient documentation

## 2019-05-05 DIAGNOSIS — L853 Xerosis cutis: Secondary | ICD-10-CM

## 2019-05-05 HISTORY — DX: Xerosis cutis: L85.3

## 2019-05-05 HISTORY — DX: Disorder of the skin and subcutaneous tissue, unspecified: L98.9

## 2019-05-20 ENCOUNTER — Other Ambulatory Visit: Payer: Self-pay | Admitting: Cardiology

## 2019-05-23 ENCOUNTER — Other Ambulatory Visit: Payer: Self-pay | Admitting: Cardiology

## 2019-08-14 DIAGNOSIS — R29898 Other symptoms and signs involving the musculoskeletal system: Secondary | ICD-10-CM

## 2019-08-14 HISTORY — DX: Other symptoms and signs involving the musculoskeletal system: R29.898

## 2019-08-16 ENCOUNTER — Other Ambulatory Visit: Payer: Self-pay | Admitting: Cardiology

## 2019-08-27 ENCOUNTER — Other Ambulatory Visit: Payer: Self-pay | Admitting: Cardiology

## 2019-09-18 DIAGNOSIS — M7752 Other enthesopathy of left foot: Secondary | ICD-10-CM

## 2019-09-18 HISTORY — DX: Other enthesopathy of left foot and ankle: M77.52

## 2019-10-13 DIAGNOSIS — R630 Anorexia: Secondary | ICD-10-CM

## 2019-10-13 HISTORY — DX: Anorexia: R63.0

## 2019-10-16 ENCOUNTER — Other Ambulatory Visit: Payer: Self-pay | Admitting: Cardiology

## 2019-11-18 DIAGNOSIS — F0390 Unspecified dementia without behavioral disturbance: Secondary | ICD-10-CM | POA: Insufficient documentation

## 2019-11-18 HISTORY — DX: Unspecified dementia, unspecified severity, without behavioral disturbance, psychotic disturbance, mood disturbance, and anxiety: F03.90

## 2019-11-25 ENCOUNTER — Other Ambulatory Visit: Payer: Self-pay | Admitting: Cardiology

## 2019-11-27 DIAGNOSIS — F0280 Dementia in other diseases classified elsewhere without behavioral disturbance: Secondary | ICD-10-CM

## 2019-11-27 DIAGNOSIS — G3183 Dementia with Lewy bodies: Secondary | ICD-10-CM | POA: Insufficient documentation

## 2019-11-27 DIAGNOSIS — G609 Hereditary and idiopathic neuropathy, unspecified: Secondary | ICD-10-CM

## 2019-11-27 HISTORY — DX: Dementia in other diseases classified elsewhere, unspecified severity, without behavioral disturbance, psychotic disturbance, mood disturbance, and anxiety: F02.80

## 2019-11-27 HISTORY — DX: Hereditary and idiopathic neuropathy, unspecified: G60.9

## 2019-12-25 ENCOUNTER — Other Ambulatory Visit: Payer: Self-pay | Admitting: Cardiology

## 2020-01-20 DIAGNOSIS — I5030 Unspecified diastolic (congestive) heart failure: Secondary | ICD-10-CM | POA: Insufficient documentation

## 2020-01-20 HISTORY — DX: Unspecified diastolic (congestive) heart failure: I50.30

## 2020-03-10 ENCOUNTER — Other Ambulatory Visit: Payer: Self-pay | Admitting: Cardiology

## 2020-03-22 ENCOUNTER — Other Ambulatory Visit: Payer: Self-pay | Admitting: Cardiology

## 2020-03-22 NOTE — Telephone Encounter (Signed)
Rx refill sent to pharmacy. 

## 2020-04-07 ENCOUNTER — Telehealth: Payer: Self-pay | Admitting: Cardiology

## 2020-04-07 NOTE — Telephone Encounter (Signed)
Reports SOB that is more noticeable with activity. Reports chest pain at times (not often) and has not used nitroglycerin. Last weight 194 lbs 2-3 days ago. Weight 212 lbs today.  Reports taking furosemide 20 mg daily but took 40 mg this morning.  Advised to continue 40 mg lasix daily and message would be sent to provider for recommendations Has appointment scheduled 05/16/2020 with Dr. Bing Matter Verbalized understanding

## 2020-04-07 NOTE — Telephone Encounter (Signed)
Also please ask him to come to our office to have Chem-7 as well as proBNP done

## 2020-04-07 NOTE — Telephone Encounter (Signed)
Patient informed and verbalized understanding of plan. Reports having lab work at PCP office a few minutes ago. Contacted PCP to clarify labs done and was advised that patient had BMP & BNP drawn. Chem 7 and BNP ordered by Dr. Bing Matter not ordered and MD aware.

## 2020-04-07 NOTE — Telephone Encounter (Signed)
Pt c/o swelling: STAT is pt has developed SOB within 24 hours  1) How much weight have you gained and in what time span? Has not noticed  2) If swelling, where is the swelling located? Feet and legs  3) Are you currently taking a fluid pill? yes  4) Are you currently SOB? No  5) Do you have a log of your daily weights (if so, list)? no  6) Have you gained 3 pounds in a day or 5 pounds in a week? no  7) Have you traveled recently? No   Patient states he saw his PCP and they told him to see his cardiologist for the swelling in his feet and legs. He states he gets SOB when moving around, but was not currently SOB on the phone. He states he has not noticed a weight gain. Please advise.

## 2020-04-08 ENCOUNTER — Telehealth: Payer: Self-pay | Admitting: Cardiology

## 2020-04-08 ENCOUNTER — Other Ambulatory Visit: Payer: Self-pay | Admitting: Cardiology

## 2020-04-08 DIAGNOSIS — I25119 Atherosclerotic heart disease of native coronary artery with unspecified angina pectoris: Secondary | ICD-10-CM

## 2020-04-08 DIAGNOSIS — Z79899 Other long term (current) drug therapy: Secondary | ICD-10-CM

## 2020-04-08 MED ORDER — ATORVASTATIN CALCIUM 40 MG PO TABS
40.0000 mg | ORAL_TABLET | Freq: Every day | ORAL | 3 refills | Status: DC
Start: 2020-04-08 — End: 2020-04-19

## 2020-04-08 NOTE — Addendum Note (Signed)
Addended by: Hazle Quant on: 04/08/2020 04:48 PM   Modules accepted: Orders

## 2020-04-08 NOTE — Telephone Encounter (Signed)
Refill sent in per request.  

## 2020-04-08 NOTE — Telephone Encounter (Signed)
Pt c/o swelling: STAT is pt has developed SOB within 24 hours  1) How much weight have you gained and in what time span? Patient states he gained 15-20 lbs over the past few weeks. He states he normally weighs around 194 lbs, but now he weighs 214 lbs  2) If swelling, where is the swelling located? Feet and legs  3) Are you currently taking a fluid pill? Yes   4) Are you currently SOB? No   5) Do you have a log of your daily weights (if so, list)?  No log available   6) Have you gained 3 pounds in a day or 5 pounds in a week? Patient is not sure   7) Have you traveled recently? No   Patient states he recently had lab work with his pcp and he would like to make Dr. Bing Matter aware that today the results came in, showing that he has been taking in too much sodium. Please return call to discuss further.

## 2020-04-08 NOTE — Telephone Encounter (Signed)
Please have him come back in 1 week to our office to get BMP done.

## 2020-04-08 NOTE — Telephone Encounter (Signed)
Called and spoke to patient. He reports he has gained 15-20 lbs in the past few weeks. He has had leg swelling for months that is getting worse, and shortness of breath for weeks as well. He recently had labs drawn at pcp office yesterday and was told his sodium was high. He was told to increase lasix from 20 to 40 mg daily. He reports his pcp office isn't going to recheck labs after the increase of medication to his knowledge. I will check with DOD and determine if patient needs to have repeat labs at our office.

## 2020-04-08 NOTE — Telephone Encounter (Signed)
*  STAT* If patient is at the pharmacy, call can be transferred to refill team.   1. Which medications need to be refilled? (please list name of each medication and dose if known)  atorvastatin (LIPITOR) 40 MG tablet; Nebivolol HCl 20 MG TABS  2. Which pharmacy/location (including street and city if local pharmacy) is medication to be sent to? [id number] - [place], [place] - [address]  3. Do they need a 30 day or 90 day supply? 30 days

## 2020-04-08 NOTE — Telephone Encounter (Signed)
Called patient. Informed him to come have labs redrawn next week he verbally understood no further questions.

## 2020-04-11 LAB — BASIC METABOLIC PANEL
BUN/Creatinine Ratio: 5 — ABNORMAL LOW (ref 10–24)
BUN: 8 mg/dL (ref 8–27)
CO2: 26 mmol/L (ref 20–29)
Calcium: 8.6 mg/dL (ref 8.6–10.2)
Chloride: 100 mmol/L (ref 96–106)
Creatinine, Ser: 1.75 mg/dL — ABNORMAL HIGH (ref 0.76–1.27)
GFR calc Af Amer: 43 mL/min/{1.73_m2} — ABNORMAL LOW (ref >59)
GFR calc non Af Amer: 38 mL/min/{1.73_m2} — ABNORMAL LOW (ref >59)
Glucose: 121 mg/dL — ABNORMAL HIGH (ref 65–99)
Potassium: 3.1 mmol/L — ABNORMAL LOW (ref 3.5–5.2)
Sodium: 140 mmol/L (ref 134–144)

## 2020-04-12 ENCOUNTER — Telehealth: Payer: Self-pay | Admitting: Cardiology

## 2020-04-12 NOTE — Telephone Encounter (Signed)
Patient is returning Richard Choi's call. Please advise.

## 2020-04-12 NOTE — Telephone Encounter (Signed)
Left message for pt to call.

## 2020-04-12 NOTE — Telephone Encounter (Signed)
Patient is calling for lab results.

## 2020-04-13 NOTE — Telephone Encounter (Signed)
Richard Ripple, DO  04/11/2020 4:53 PM EST Back to Top     Your creatinine has increased, compared to a year ago. Your 1.75 today in last year you were 1.32. For now we will continue your current doses Lasix and request blood work from your PCP from in between the last time you had blood work with [place]. Your potassium is really low is 3.1. I recommend the next 3 days to take 20 mEq of the potassium.   Patient still sleeping, spoke with Richard Choi, ok per DPR. She verbalized understanding of the results and recommendations. She is aware to have patient take potassium 20 mEq daily for 3 days then resume previous dosing.  States patient went to Dr Richard Choi yesterday, who decreased furosemide to 20 mg daily.  Advised I will make Dr Richard Choi aware and let them know if any additional recommendations are received.  Patient's lab work from PCP is located and available in [employer].

## 2020-04-19 ENCOUNTER — Other Ambulatory Visit: Payer: Self-pay | Admitting: Cardiology

## 2020-05-06 ENCOUNTER — Other Ambulatory Visit: Payer: Self-pay | Admitting: Cardiology

## 2020-05-09 DIAGNOSIS — I340 Nonrheumatic mitral (valve) insufficiency: Secondary | ICD-10-CM

## 2020-05-09 DIAGNOSIS — I361 Nonrheumatic tricuspid (valve) insufficiency: Secondary | ICD-10-CM

## 2020-05-10 DIAGNOSIS — I4891 Unspecified atrial fibrillation: Secondary | ICD-10-CM | POA: Diagnosis not present

## 2020-05-10 DIAGNOSIS — I509 Heart failure, unspecified: Secondary | ICD-10-CM

## 2020-05-10 DIAGNOSIS — S72009A Fracture of unspecified part of neck of unspecified femur, initial encounter for closed fracture: Secondary | ICD-10-CM

## 2020-05-10 DIAGNOSIS — I2510 Atherosclerotic heart disease of native coronary artery without angina pectoris: Secondary | ICD-10-CM

## 2020-05-10 DIAGNOSIS — I10 Essential (primary) hypertension: Secondary | ICD-10-CM

## 2020-05-10 DIAGNOSIS — I272 Pulmonary hypertension, unspecified: Secondary | ICD-10-CM

## 2020-05-11 DIAGNOSIS — S72009A Fracture of unspecified part of neck of unspecified femur, initial encounter for closed fracture: Secondary | ICD-10-CM | POA: Diagnosis not present

## 2020-05-11 DIAGNOSIS — I4891 Unspecified atrial fibrillation: Secondary | ICD-10-CM | POA: Diagnosis not present

## 2020-05-11 DIAGNOSIS — I272 Pulmonary hypertension, unspecified: Secondary | ICD-10-CM | POA: Diagnosis not present

## 2020-05-11 DIAGNOSIS — I509 Heart failure, unspecified: Secondary | ICD-10-CM | POA: Diagnosis not present

## 2020-05-12 DIAGNOSIS — I272 Pulmonary hypertension, unspecified: Secondary | ICD-10-CM | POA: Diagnosis not present

## 2020-05-12 DIAGNOSIS — I4891 Unspecified atrial fibrillation: Secondary | ICD-10-CM | POA: Diagnosis not present

## 2020-05-12 DIAGNOSIS — I509 Heart failure, unspecified: Secondary | ICD-10-CM | POA: Diagnosis not present

## 2020-05-12 DIAGNOSIS — S72009A Fracture of unspecified part of neck of unspecified femur, initial encounter for closed fracture: Secondary | ICD-10-CM | POA: Diagnosis not present

## 2020-05-13 DIAGNOSIS — F32A Depression, unspecified: Secondary | ICD-10-CM | POA: Insufficient documentation

## 2020-05-13 DIAGNOSIS — I10 Essential (primary) hypertension: Secondary | ICD-10-CM | POA: Insufficient documentation

## 2020-05-13 DIAGNOSIS — I509 Heart failure, unspecified: Secondary | ICD-10-CM | POA: Diagnosis not present

## 2020-05-13 DIAGNOSIS — S72009A Fracture of unspecified part of neck of unspecified femur, initial encounter for closed fracture: Secondary | ICD-10-CM | POA: Diagnosis not present

## 2020-05-13 DIAGNOSIS — I272 Pulmonary hypertension, unspecified: Secondary | ICD-10-CM | POA: Diagnosis not present

## 2020-05-13 DIAGNOSIS — K859 Acute pancreatitis without necrosis or infection, unspecified: Secondary | ICD-10-CM | POA: Insufficient documentation

## 2020-05-13 DIAGNOSIS — I4891 Unspecified atrial fibrillation: Secondary | ICD-10-CM | POA: Diagnosis not present

## 2020-05-13 DIAGNOSIS — M1990 Unspecified osteoarthritis, unspecified site: Secondary | ICD-10-CM | POA: Insufficient documentation

## 2020-05-13 DIAGNOSIS — I2510 Atherosclerotic heart disease of native coronary artery without angina pectoris: Secondary | ICD-10-CM | POA: Insufficient documentation

## 2020-05-14 DIAGNOSIS — I4891 Unspecified atrial fibrillation: Secondary | ICD-10-CM

## 2020-05-14 DIAGNOSIS — I10 Essential (primary) hypertension: Secondary | ICD-10-CM

## 2020-05-14 DIAGNOSIS — E785 Hyperlipidemia, unspecified: Secondary | ICD-10-CM

## 2020-05-14 DIAGNOSIS — N183 Chronic kidney disease, stage 3 unspecified: Secondary | ICD-10-CM

## 2020-05-14 DIAGNOSIS — I2510 Atherosclerotic heart disease of native coronary artery without angina pectoris: Secondary | ICD-10-CM | POA: Diagnosis not present

## 2020-05-14 DIAGNOSIS — E876 Hypokalemia: Secondary | ICD-10-CM

## 2020-05-14 DIAGNOSIS — R410 Disorientation, unspecified: Secondary | ICD-10-CM

## 2020-05-16 ENCOUNTER — Ambulatory Visit: Payer: Medicare (Managed Care) | Admitting: Cardiology

## 2020-05-18 DIAGNOSIS — I4891 Unspecified atrial fibrillation: Secondary | ICD-10-CM | POA: Diagnosis not present

## 2020-06-21 DEATH — deceased
